# Patient Record
Sex: Female | Born: 1985
Health system: Southern US, Community
[De-identification: ages and names within clinical notes are randomized; demographics above are authoritative.]

## PROBLEM LIST (undated history)

## (undated) DIAGNOSIS — Z789 Other specified health status: Secondary | ICD-10-CM

## (undated) DIAGNOSIS — T07XXXA Unspecified multiple injuries, initial encounter: Secondary | ICD-10-CM

## (undated) HISTORY — PX: LEG SURGERY: SHX1003

## (undated) HISTORY — PX: MANDIBLE SURGERY: SHX707

## (undated) HISTORY — PX: NECK SURGERY: SHX720

## (undated) HISTORY — PX: SPINE SURGERY: SHX786

## (undated) HISTORY — DX: Unspecified multiple injuries, initial encounter: T07.XXXA

---

## 2004-07-07 ENCOUNTER — Inpatient Hospital Stay: Payer: Self-pay | Admitting: Certified Nurse Midwife

## 2005-05-02 ENCOUNTER — Emergency Department: Payer: Self-pay | Admitting: Emergency Medicine

## 2005-06-09 ENCOUNTER — Emergency Department: Payer: Self-pay | Admitting: Emergency Medicine

## 2005-06-11 ENCOUNTER — Emergency Department: Payer: Self-pay | Admitting: Emergency Medicine

## 2005-06-13 ENCOUNTER — Emergency Department: Payer: Self-pay | Admitting: Emergency Medicine

## 2005-07-17 ENCOUNTER — Emergency Department: Payer: Self-pay | Admitting: Emergency Medicine

## 2005-08-30 ENCOUNTER — Emergency Department: Payer: Self-pay | Admitting: Unknown Physician Specialty

## 2005-09-01 ENCOUNTER — Emergency Department: Payer: Self-pay | Admitting: Internal Medicine

## 2005-10-10 ENCOUNTER — Emergency Department: Payer: Self-pay | Admitting: Emergency Medicine

## 2005-10-10 ENCOUNTER — Ambulatory Visit: Payer: Self-pay | Admitting: Emergency Medicine

## 2006-01-20 ENCOUNTER — Observation Stay: Payer: Self-pay | Admitting: Obstetrics and Gynecology

## 2006-01-23 ENCOUNTER — Emergency Department: Payer: Self-pay | Admitting: Emergency Medicine

## 2006-03-30 ENCOUNTER — Observation Stay: Payer: Self-pay

## 2006-04-09 ENCOUNTER — Observation Stay: Payer: Self-pay | Admitting: Obstetrics and Gynecology

## 2006-04-14 ENCOUNTER — Observation Stay: Payer: Self-pay

## 2006-05-02 ENCOUNTER — Observation Stay: Payer: Self-pay

## 2006-05-24 ENCOUNTER — Observation Stay: Payer: Self-pay

## 2006-05-25 ENCOUNTER — Inpatient Hospital Stay: Payer: Self-pay

## 2006-06-12 ENCOUNTER — Emergency Department: Payer: Self-pay

## 2006-06-13 ENCOUNTER — Emergency Department: Payer: Self-pay | Admitting: Emergency Medicine

## 2006-06-14 ENCOUNTER — Emergency Department: Payer: Self-pay | Admitting: Emergency Medicine

## 2006-07-13 ENCOUNTER — Emergency Department: Payer: Self-pay | Admitting: Emergency Medicine

## 2006-07-27 ENCOUNTER — Emergency Department: Payer: Self-pay | Admitting: Emergency Medicine

## 2007-03-10 ENCOUNTER — Emergency Department: Payer: Self-pay | Admitting: Emergency Medicine

## 2007-03-10 ENCOUNTER — Emergency Department: Payer: Self-pay | Admitting: Unknown Physician Specialty

## 2007-04-28 ENCOUNTER — Emergency Department: Payer: Self-pay | Admitting: Emergency Medicine

## 2007-05-22 ENCOUNTER — Emergency Department: Payer: Self-pay

## 2007-10-08 ENCOUNTER — Emergency Department: Payer: Self-pay | Admitting: Emergency Medicine

## 2008-04-08 ENCOUNTER — Emergency Department: Payer: Self-pay | Admitting: Internal Medicine

## 2008-10-03 ENCOUNTER — Emergency Department: Payer: Self-pay

## 2009-04-03 ENCOUNTER — Emergency Department: Payer: Self-pay | Admitting: Emergency Medicine

## 2010-01-02 ENCOUNTER — Emergency Department: Payer: Self-pay | Admitting: Emergency Medicine

## 2013-05-09 ENCOUNTER — Emergency Department: Payer: Self-pay | Admitting: Emergency Medicine

## 2013-05-11 LAB — BETA STREP CULTURE(ARMC)

## 2013-08-15 ENCOUNTER — Emergency Department: Payer: Self-pay | Admitting: Internal Medicine

## 2014-08-18 ENCOUNTER — Emergency Department: Payer: Self-pay | Admitting: Emergency Medicine

## 2015-01-06 ENCOUNTER — Encounter: Payer: Self-pay | Admitting: Emergency Medicine

## 2015-01-06 DIAGNOSIS — K088 Other specified disorders of teeth and supporting structures: Secondary | ICD-10-CM | POA: Diagnosis not present

## 2015-01-06 DIAGNOSIS — K0381 Cracked tooth: Secondary | ICD-10-CM | POA: Diagnosis not present

## 2015-01-06 NOTE — ED Notes (Signed)
Patient ambulatory to triage with steady gait, without difficulty or distress noted; pt reports right lower dental pain today

## 2015-01-07 ENCOUNTER — Emergency Department
Admission: EM | Admit: 2015-01-07 | Discharge: 2015-01-07 | Disposition: A | Discharge status: Home / Self Care | Payer: PRIVATE HEALTH INSURANCE | Attending: Emergency Medicine | Admitting: Emergency Medicine

## 2015-01-07 DIAGNOSIS — K0889 Other specified disorders of teeth and supporting structures: Secondary | ICD-10-CM

## 2015-01-07 MED ORDER — PENICILLIN V POTASSIUM 250 MG PO TABS
500.0000 mg | ORAL_TABLET | Freq: Once | ORAL | Status: AC
  Administered 2015-01-07: 500 mg via ORAL

## 2015-01-07 MED ORDER — HYDROCODONE-ACETAMINOPHEN 5-325 MG PO TABS: 1.0000 | ORAL_TABLET | Freq: Four times a day (QID) | ORAL | Status: DC | PRN

## 2015-01-07 MED ORDER — HYDROCODONE-ACETAMINOPHEN 5-325 MG PO TABS
1.0000 | ORAL_TABLET | Freq: Once | ORAL | Status: AC
  Administered 2015-01-07: 1 via ORAL

## 2015-01-07 MED ORDER — PENICILLIN V POTASSIUM 250 MG PO TABS
ORAL_TABLET | ORAL | Status: AC
  Administered 2015-01-07: 500 mg via ORAL
  Filled 2015-01-07: qty 2

## 2015-01-07 MED ORDER — IBUPROFEN 600 MG PO TABS: 600.0000 mg | ORAL_TABLET | Freq: Three times a day (TID) | ORAL | Status: DC | PRN

## 2015-01-07 MED ORDER — IBUPROFEN 600 MG PO TABS
600.0000 mg | ORAL_TABLET | Freq: Once | ORAL | Status: AC
  Administered 2015-01-07: 600 mg via ORAL

## 2015-01-07 MED ORDER — LIDOCAINE VISCOUS 2 % MT SOLN
15.0000 mL | Freq: Once | OROMUCOSAL | Status: AC
  Administered 2015-01-07: 15 mL via OROMUCOSAL

## 2015-01-07 MED ORDER — HYDROCODONE-ACETAMINOPHEN 5-325 MG PO TABS
ORAL_TABLET | ORAL | Status: AC
  Administered 2015-01-07: 1 via ORAL
  Filled 2015-01-07: qty 1

## 2015-01-07 MED ORDER — IBUPROFEN 600 MG PO TABS
ORAL_TABLET | ORAL | Status: AC
  Administered 2015-01-07: 600 mg via ORAL
  Filled 2015-01-07: qty 1

## 2015-01-07 MED ORDER — LIDOCAINE VISCOUS 2 % MT SOLN
OROMUCOSAL | Status: AC
  Administered 2015-01-07: 15 mL via OROMUCOSAL
  Filled 2015-01-07: qty 15

## 2015-01-07 MED ORDER — PENICILLIN V POTASSIUM 500 MG PO TABS: 500.0000 mg | ORAL_TABLET | Freq: Four times a day (QID) | ORAL | Status: DC

## 2015-01-07 NOTE — ED Notes (Signed)
Patient ambulatory to room 18 with steady gait, without difficulty or distress noted; pt reports right lower dental pain today and the last 4 days slowly getting worse tylenol only lasting a 1-2 hrs now not at all. Pt is resting on the bed. Pt states has cavity and broken tooth to right lower back tooth. Pink mucous no swelling noted. Pt early radiated into whole right side of face and head pain was 10/10. Pt reports runny nose as well on the right side. She states no pain is 8/10. Also pt states ibuprofen doesn't work.

## 2015-01-07 NOTE — Discharge Instructions (Signed)
1. Take antibiotic as prescribed (Penicillin VK 500mg  four times daily x 7 days). 2. Take pain medicines as needed (Motrin/Norco #15). 3. Return to the ER for worsening symptoms, persistent vomiting, fever, difficulty breathing or other concerns.  Dental Pain A tooth ache may be caused by cavities (tooth decay). Cavities expose the nerve of the tooth to air and hot or cold temperatures. It may come from an infection or abscess (also called a boil or furuncle) around your tooth. It is also often caused by dental caries (tooth decay). This causes the pain you are having. DIAGNOSIS  Your caregiver can diagnose this problem by exam. TREATMENT   If caused by an infection, it may be treated with medications which kill germs (antibiotics) and pain medications as prescribed by your caregiver. Take medications as directed.  Only take over-the-counter or prescription medicines for pain, discomfort, or fever as directed by your caregiver.  Whether the tooth ache today is caused by infection or dental disease, you should see your dentist as soon as possible for further care. SEEK MEDICAL CARE IF: The exam and treatment you received today has been provided on an emergency basis only. This is not a substitute for complete medical or dental care. If your problem worsens or new problems (symptoms) appear, and you are unable to meet with your dentist, call or return to this location. SEEK IMMEDIATE MEDICAL CARE IF:   You have a fever.  You develop redness and swelling of your face, jaw, or neck.  You are unable to open your mouth.  You have severe pain uncontrolled by pain medicine. MAKE SURE YOU:   Understand these instructions.  Will watch your condition.  Will get help right away if you are not doing well or get worse. Document Released: 07/04/2005 Document Revised: 09/26/2011 Document Reviewed: 02/20/2008 [facility] Patient Information 2015 [place], [place]. This information is not intended to  replace advice given to you by your health care provider. Make sure you discuss any questions you have with your health care provider.

## 2015-01-07 NOTE — ED Provider Notes (Signed)
[facility] Emergency Department Provider Note  ____________________________________________  Time seen: Approximately 2:36 AM  I have reviewed the triage vital signs and the nursing notes.   HISTORY  Chief Complaint Dental Pain    HPI Jennifer Ross is a 29 y.o. female who presents with a 4 day history of right lower molar pain not alleviated by Tylenol. Patient states she has a known cavity and broken tooth. Her insurance with her job does not cake in until later this week. Patient complains of 8/10 dental pain radiating to the right side of her face and head. Denies fever, chills, vomiting, chest pain, shortness of breath. Eating makes the pain worse. Nothing makes the pain better.   History reviewed. No pertinent past medical history.  Patient is not a diabetic.  There are no active problems to display for this patient.   History reviewed. No pertinent past surgical history.   Allergies Review of patient's allergies indicates no known allergies.  No family history on file.  Social History History  Substance Use Topics  . Smoking status: Never Smoker   . Smokeless tobacco: Not on file  . Alcohol Use: No    Review of Systems Constitutional: No fever/chills Eyes: No visual changes. ENT: Positive for dental pain. No sore throat. Cardiovascular: Denies chest pain. Respiratory: Denies shortness of breath. Gastrointestinal: No abdominal pain.  No nausea, no vomiting.  No diarrhea.  No constipation. Genitourinary: Negative for dysuria. Musculoskeletal: Negative for back pain. Skin: Negative for rash. Neurological: Negative for headaches, focal weakness or numbness.  10-point ROS otherwise negative.  ____________________________________________   PHYSICAL EXAM:  VITAL SIGNS: ED Triage Vitals  Enc Vitals Group     BP 01/06/15 2357 125/79 mmHg     Pulse Rate 01/06/15 2357 72     Resp 01/06/15 2357 18     Temp 01/06/15 2357 98 F (36.7  C)     Temp Source 01/06/15 2357 Oral     SpO2 01/06/15 2357 100 %     Weight 01/06/15 2357 138 lb (62.596 kg)     Height 01/06/15 2357  (1.575 m)     Head Cir --      Peak Flow --      Pain Score 01/06/15 2356 10     Pain Loc --      Pain Edu? --      Excl. in GC? --     Constitutional: Alert and oriented. Well appearing and in no acute distress. Eyes: Conjunctivae are normal. PERRL. EOMI. Head: Atraumatic. Nose: No congestion/rhinnorhea. Mouth/Throat: Mucous membranes are moist.  Oropharynx non-erythematous. Right back molar with fracture; no intra/extraoral abscess noted. Neck: No stridor.   Cardiovascular: Normal rate, regular rhythm. Grossly normal heart sounds.  Good peripheral circulation. Respiratory: Normal respiratory effort.  No retractions. Lungs CTAB. Gastrointestinal: Soft and nontender. No distention. No abdominal bruits. No CVA tenderness. Musculoskeletal: No lower extremity tenderness nor edema.  No joint effusions. Neurologic:  Normal speech and language. No gross focal neurologic deficits are appreciated. Speech is normal. No gait instability. Skin:  Skin is warm, dry and intact. No rash noted. Psychiatric: Mood and affect are normal. Speech and behavior are normal.  ____________________________________________   LABS (all labs ordered are listed, but only abnormal results are displayed)  Labs Reviewed - No data to display ____________________________________________  EKG  None ____________________________________________  RADIOLOGY  None ____________________________________________   PROCEDURES  Procedure(s) performed: None  Critical Care performed: No  ____________________________________________   INITIAL  IMPRESSION / ASSESSMENT AND PLAN / ED COURSE  Pertinent labs & imaging results that were available during my care of the patient were reviewed by me and considered in my medical decision making (see chart for details).  28y/o  female presenting with dental pain. Plan for antibiotics, analgesics, dental follow-up. Strict return precautions given. Patient verbalizes understanding and agrees with plan of care. ____________________________________________   FINAL CLINICAL IMPRESSION(S) / ED DIAGNOSES  Final diagnoses:  Pain, dental      Irean Hong, MD 01/07/15 [phone]

## 2015-07-18 ENCOUNTER — Emergency Department
Admission: EM | Admit: 2015-07-18 | Discharge: 2015-07-18 | Disposition: A | Discharge status: Home / Self Care | Payer: BLUE CROSS/BLUE SHIELD | Attending: Emergency Medicine | Admitting: Emergency Medicine

## 2015-07-18 ENCOUNTER — Encounter: Payer: Self-pay | Admitting: Emergency Medicine

## 2015-07-18 DIAGNOSIS — K0889 Other specified disorders of teeth and supporting structures: Secondary | ICD-10-CM

## 2015-07-18 DIAGNOSIS — J029 Acute pharyngitis, unspecified: Secondary | ICD-10-CM | POA: Insufficient documentation

## 2015-07-18 DIAGNOSIS — Z792 Long term (current) use of antibiotics: Secondary | ICD-10-CM | POA: Diagnosis not present

## 2015-07-18 MED ORDER — AMOXICILLIN 500 MG PO TABS: 500.0000 mg | ORAL_TABLET | Freq: Three times a day (TID) | ORAL | Status: DC

## 2015-07-18 MED ORDER — NAPROXEN 500 MG PO TABS: 500.0000 mg | ORAL_TABLET | Freq: Two times a day (BID) | ORAL | Status: DC

## 2015-07-18 NOTE — ED Notes (Signed)
Sore throat since yesterday.

## 2015-07-18 NOTE — Discharge Instructions (Signed)

## 2015-07-18 NOTE — ED Provider Notes (Signed)
[facility] Emergency Department Provider Note ____________________________________________  Time seen: Approximately 6:49 PM  I have reviewed the triage vital signs and the nursing notes.   HISTORY  Chief Complaint Sore Throat   HPI Jennifer Ross is a 29 y.o. female who presents to the emergency department for evaluation of sore throat and dental pain. She states that her throat has been sore for the past couple of days. She has taken ibuprofen without relief. She states that her right wisdom tooth keeps chipping away and just before the sore throat started a large piece broke off. That area has been painful since then.   History reviewed. No pertinent past medical history.  There are no active problems to display for this patient.   History reviewed. No pertinent past surgical history.  Current Outpatient Rx  Name  Route  Sig  Dispense  Refill  . HYDROcodone-acetaminophen (NORCO) 5-325 MG per tablet   Oral   Take 1 tablet by mouth every 6 (six) hours as needed for moderate pain.   15 tablet   0   . ibuprofen (ADVIL,MOTRIN) 600 MG tablet   Oral   Take 1 tablet (600 mg total) by mouth every 8 (eight) hours as needed.   15 tablet   0   . penicillin v potassium (VEETID) 500 MG tablet   Oral   Take 1 tablet (500 mg total) by mouth 4 (four) times daily.   28 tablet   0     Allergies Review of patient's allergies indicates no known allergies.  No family history on file.  Social History Social History  Substance Use Topics  . Smoking status: Never Smoker   . Smokeless tobacco: None  . Alcohol Use: No    Review of Systems Constitutional: No fever/chills Eyes: No visual changes. ENT: No sore throat. Cardiovascular: Denies chest pain. Respiratory: Denies shortness of breath. Gastrointestinal: No abdominal pain.  No nausea, no vomiting.  Genitourinary: Negative for dysuria. Musculoskeletal: Negative for back pain. Skin: Negative for  rash. Neurological: Negative for headaches, focal weakness or numbness. 10-point ROS otherwise negative.  ____________________________________________   PHYSICAL EXAM:  VITAL SIGNS: ED Triage Vitals  Enc Vitals Group     BP 07/18/15 1828 121/59 mmHg     Pulse Rate 07/18/15 1828 70     Resp 07/18/15 1828 18     Temp 07/18/15 1828 98.1 F (36.7 C)     Temp Source 07/18/15 1828 Oral     SpO2 07/18/15 1828 100 %     Weight 07/18/15 1828 125 lb (56.7 kg)     Height 07/18/15 1828 5\' 2"  (1.575 m)     Head Cir --      Peak Flow --      Pain Score 07/18/15 1829 8     Pain Loc --      Pain Edu? --      Excl. in GC? --     Constitutional: Alert and oriented. Well appearing and in no acute distress. Eyes: Conjunctivae are normal. PERRL. EOMI. Head: Atraumatic. Nose: No congestion/rhinnorhea. Mouth/Throat: Mucous membranes are moist.  Oropharynx erythematous without exudate. Periodontal Exam    Neck: No stridor.  Hematological/Lymphatic/Immunilogical: No cervical lymphadenopathy. Cardiovascular:   Good peripheral circulation. Respiratory: Normal respiratory effort.  No retractions. Musculoskeletal: No lower extremity tenderness nor edema.  No joint effusions. Neurologic:  Normal speech and language. No gross focal neurologic deficits are appreciated. Speech is normal. No gait instability. Skin:  Skin is warm, dry  and intact. No rash noted. Psychiatric: Mood and affect are normal. Speech and behavior are normal.  ____________________________________________   LABS (all labs ordered are listed, but only abnormal results are displayed)  Labs Reviewed - No data to display ____________________________________________   RADIOLOGY  Not indicated ____________________________________________   PROCEDURES  Procedure(s) performed: None  Critical Care performed: No  ____________________________________________   INITIAL IMPRESSION / ASSESSMENT AND PLAN / ED  COURSE  Pertinent labs & imaging results that were available during my care of the patient were reviewed by me and considered in my medical decision making (see chart for details).  Patient was advised that the pharyngitis is likely viral. She was advised to take ibuprofen or Tylenol if needed for pain.  Patient was advised to see the dentist within 14 days. Also advised to take the antibiotic until finished. Instructed to return to the ER for symptoms that change or worsen if you are unable to schedule an appointment. ____________________________________________   FINAL CLINICAL IMPRESSION(S) / ED DIAGNOSES  Final diagnoses:  Pharyngitis  Pain, dental       Chinita Pester, FNP 07/18/15 2108  Sharman Cheek, MD 07/18/15 2245

## 2016-06-20 ENCOUNTER — Encounter: Payer: Self-pay | Admitting: Emergency Medicine

## 2016-06-20 ENCOUNTER — Emergency Department
Admission: EM | Admit: 2016-06-20 | Discharge: 2016-06-20 | Disposition: A | Discharge status: Home / Self Care | Payer: BLUE CROSS/BLUE SHIELD | Attending: Emergency Medicine | Admitting: Emergency Medicine

## 2016-06-20 DIAGNOSIS — B9789 Other viral agents as the cause of diseases classified elsewhere: Secondary | ICD-10-CM

## 2016-06-20 DIAGNOSIS — J988 Other specified respiratory disorders: Secondary | ICD-10-CM

## 2016-06-20 DIAGNOSIS — R509 Fever, unspecified: Secondary | ICD-10-CM | POA: Diagnosis present

## 2016-06-20 DIAGNOSIS — J069 Acute upper respiratory infection, unspecified: Secondary | ICD-10-CM | POA: Insufficient documentation

## 2016-06-20 MED ORDER — BENZONATATE 100 MG PO CAPS: 200.0000 mg | ORAL_CAPSULE | Freq: Three times a day (TID) | ORAL | 0 refills | Status: AC | PRN

## 2016-06-20 NOTE — ED Provider Notes (Signed)
[facility] Emergency Department Provider Note  ____________________________________________   First MD Initiated Contact with Patient 06/20/16 1454     (approximate)  I have reviewed the triage vital signs and the nursing notes.   HISTORY  Chief Complaint Hoarse and Fever    HPI Jennifer Ross is a 30 y.o. female . Complaint of fever off and on for several days. Patient states she has productive cough. She has not actually taken her temperature but has felt warm and occasionally has some chills. She denies any shortness of breath. She mostly complains about throat soreness especially at night when she is coughing and the fact that her voice is hoarse. She has taken over-the-counter NyQuil with minimal relief. Patient denies any sneezing. Denies any history of asthma, bronchitis, pneumonia. She rates her pain as 5 out of 10.   History reviewed. No pertinent past medical history.  There are no active problems to display for this patient.   History reviewed. No pertinent surgical history.  Prior to Admission medications   Medication Sig Start Date End Date Taking? Authorizing Provider  benzonatate (TESSALON PERLES) 100 MG capsule Take 2 capsules (200 mg total) by mouth 3 (three) times daily as needed for cough. 06/20/16 06/20/17  Tommi Rumpshonda L Dyneshia Baccam, PA-C    Allergies Patient has no known allergies.  No family history on file.  Social History Social History  Substance Use Topics  . Smoking status: Never Smoker  . Smokeless tobacco: Never Used  . Alcohol use No    Review of Systems Constitutional: Subjective fever/chills Eyes: No visual changes. ENT: Positive sore throat. Positive nasal congestion Cardiovascular: Denies chest pain. Respiratory: Denies shortness of breath. Positive productive cough. Gastrointestinal: No abdominal pain.  No nausea, no vomiting.   Musculoskeletal: Negative for back pain. Skin: Negative for rash. Neurological:  Negative for headaches, focal weakness or numbness.  10-point ROS otherwise negative.  ____________________________________________   PHYSICAL EXAM:  VITAL SIGNS: ED Triage Vitals  Enc Vitals Group     BP 06/20/16 1251 119/74     Pulse Rate 06/20/16 1251 97     Resp 06/20/16 1251 16     Temp 06/20/16 1251 98.6 F (37 C)     Temp Source 06/20/16 1251 Oral     SpO2 06/20/16 1251 97 %     Weight 06/20/16 1252 145 lb (65.8 kg)     Height 06/20/16 1252 5\' 2"  (1.575 m)     Head Circumference --      Peak Flow --      Pain Score 06/20/16 1405 5     Pain Loc --      Pain Edu? --      Excl. in GC? --     Constitutional: Alert and oriented. Well appearing and in no acute distress. Eyes: Conjunctivae are normal. PERRL. EOMI. Head: Atraumatic.  Nontender sinuses to percussion. Nose: Moderate congestion/no rhinnorhea.  EACs are clear. TMs are dull bilaterally without injection or effusion. Mouth/Throat: Mucous membranes are moist.  Oropharynx non-erythematous. Moderate posterior drainage seen. Neck: No stridor.   Hematological/Lymphatic/Immunilogical: No cervical lymphadenopathy. Cardiovascular: Normal rate, regular rhythm. Grossly normal heart sounds.  Good peripheral circulation. Respiratory: Normal respiratory effort.  No retractions. Lungs CTAB. Musculoskeletal: Moves upper and lower extremities without any difficulty. Normal gait was noted. Neurologic:  Normal speech and language. No gross focal neurologic deficits are appreciated. No gait instability. Skin:  Skin is warm, dry and intact. No rash noted. Psychiatric: Mood and affect are normal.  Speech and behavior are normal.  ____________________________________________   LABS (all labs ordered are listed, but only abnormal results are displayed)  Labs Reviewed - No data to display  PROCEDURES  Procedure(s) performed: None  Procedures  Critical Care performed:  No  ____________________________________________   INITIAL IMPRESSION / ASSESSMENT AND PLAN / ED COURSE  Pertinent labs & imaging results that were available during my care of the patient were reviewed by me and considered in my medical decision making (see chart for details).    Clinical Course    Patient is to obtain over-the-counter Zyrtec or Claritin for her drainage. She is to increase fluids. She is given a prescription for [employer] Perles as needed for cough. [facility] if any continued problems.  ____________________________________________   FINAL CLINICAL IMPRESSION(S) / ED DIAGNOSES  Final diagnoses:  Viral respiratory illness      NEW MEDICATIONS STARTED DURING THIS VISIT:  Discharge Medication List as of 06/20/2016  3:14 PM    START taking these medications   Details  benzonatate (TESSALON PERLES) 100 MG capsule Take 2 capsules (200 mg total) by mouth 3 (three) times daily as needed for cough., Starting Mon 06/20/2016, Until Tue 06/20/2017, Print         Note:  This document was prepared using Dragon voice recognition software and may include unintentional dictation errors.    Tommi RumpsRhonda L Mavery Milling, PA-C 06/20/16 1650    Emily FilbertJonathan E Williams, MD 06/21/16 2019

## 2016-06-20 NOTE — ED Triage Notes (Signed)
Fever on and off.  Now with hoarse and lost voice .  Also porductive cough

## 2016-06-20 NOTE — ED Notes (Signed)
Pt c/o hoarsness and dry throat with productive cough xfew days. States some chills. Pt denies any SOB

## 2016-06-20 NOTE — Discharge Instructions (Signed)
Obtained over-the-counter Zyrtec or Claritin for posterior drainage which will help decrease the amount of coughing that you do at tonight. Increase fluids. Tessalon as needed for cough. [facility] if any continued problems.

## 2016-12-09 ENCOUNTER — Encounter: Payer: Self-pay | Admitting: Advanced Practice Midwife

## 2017-05-13 DIAGNOSIS — I7771 Dissection of carotid artery: Secondary | ICD-10-CM | POA: Insufficient documentation

## 2017-12-13 ENCOUNTER — Ambulatory Visit: Payer: Medicaid Other | Admitting: Physical Therapy

## 2017-12-20 ENCOUNTER — Ambulatory Visit: Payer: Medicaid Other | Attending: Plastic Surgery | Admitting: Physical Therapy

## 2017-12-20 ENCOUNTER — Other Ambulatory Visit: Payer: Self-pay

## 2017-12-20 ENCOUNTER — Encounter: Payer: Self-pay | Admitting: Physical Therapy

## 2017-12-20 ENCOUNTER — Ambulatory Visit: Payer: Medicaid Other | Admitting: Physical Therapy

## 2017-12-20 DIAGNOSIS — M6281 Muscle weakness (generalized): Secondary | ICD-10-CM | POA: Diagnosis present

## 2017-12-20 DIAGNOSIS — M79602 Pain in left arm: Secondary | ICD-10-CM | POA: Diagnosis present

## 2017-12-20 DIAGNOSIS — M62838 Other muscle spasm: Secondary | ICD-10-CM | POA: Diagnosis present

## 2017-12-20 DIAGNOSIS — M542 Cervicalgia: Secondary | ICD-10-CM | POA: Diagnosis not present
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
## 2017-12-26 ENCOUNTER — Encounter: Payer: PRIVATE HEALTH INSURANCE | Admitting: Physical Therapy

## 2017-12-27 ENCOUNTER — Ambulatory Visit: Payer: Medicaid Other | Admitting: Physical Therapy

## 2018-01-01 ENCOUNTER — Encounter: Payer: PRIVATE HEALTH INSURANCE | Admitting: Physical Therapy

## 2018-01-02 ENCOUNTER — Encounter: Payer: Self-pay | Admitting: Physical Therapy

## 2018-01-02 ENCOUNTER — Ambulatory Visit: Payer: Medicaid Other | Admitting: Physical Therapy

## 2018-01-02 DIAGNOSIS — M79602 Pain in left arm: Secondary | ICD-10-CM

## 2018-01-02 DIAGNOSIS — M542 Cervicalgia: Secondary | ICD-10-CM

## 2018-01-02 DIAGNOSIS — M6281 Muscle weakness (generalized): Secondary | ICD-10-CM

## 2018-01-02 DIAGNOSIS — M62838 Other muscle spasm: Secondary | ICD-10-CM
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
## 2018-01-03 ENCOUNTER — Encounter: Payer: PRIVATE HEALTH INSURANCE | Admitting: Physical Therapy

## 2018-01-10 ENCOUNTER — Encounter: Payer: Self-pay | Admitting: Physical Therapy

## 2018-01-10 ENCOUNTER — Ambulatory Visit: Payer: Medicaid Other | Admitting: Physical Therapy

## 2018-01-10 DIAGNOSIS — M542 Cervicalgia: Secondary | ICD-10-CM

## 2018-01-10 DIAGNOSIS — M62838 Other muscle spasm: Secondary | ICD-10-CM

## 2018-01-10 DIAGNOSIS — M6281 Muscle weakness (generalized): Secondary | ICD-10-CM

## 2018-01-10 DIAGNOSIS — M79602 Pain in left arm: Secondary | ICD-10-CM
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
## 2018-01-15 ENCOUNTER — Ambulatory Visit: Payer: Medicaid Other | Attending: Plastic Surgery

## 2018-01-15 DIAGNOSIS — M79602 Pain in left arm: Secondary | ICD-10-CM | POA: Insufficient documentation

## 2018-01-15 DIAGNOSIS — M542 Cervicalgia: Secondary | ICD-10-CM | POA: Diagnosis present

## 2018-01-15 DIAGNOSIS — M6281 Muscle weakness (generalized): Secondary | ICD-10-CM | POA: Diagnosis present

## 2018-01-15 DIAGNOSIS — M62838 Other muscle spasm: Secondary | ICD-10-CM | POA: Insufficient documentation
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
## 2018-01-17 ENCOUNTER — Ambulatory Visit: Payer: Medicaid Other | Admitting: Physical Therapy

## 2018-01-22 ENCOUNTER — Ambulatory Visit: Payer: Medicaid Other | Admitting: Physical Therapy

## 2018-01-25 ENCOUNTER — Ambulatory Visit: Payer: Medicaid Other | Admitting: Physical Therapy

## 2018-01-29 ENCOUNTER — Ambulatory Visit: Payer: Medicaid Other | Admitting: Physical Therapy

## 2018-01-31 ENCOUNTER — Ambulatory Visit: Payer: Medicaid Other | Admitting: Physical Therapy

## 2018-02-05 ENCOUNTER — Ambulatory Visit: Payer: Medicaid Other | Admitting: Physical Therapy

## 2018-02-08 ENCOUNTER — Ambulatory Visit: Payer: Medicaid Other | Admitting: Physical Therapy

## 2018-02-09 ENCOUNTER — Telehealth: Payer: Self-pay | Admitting: Physical Therapy
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
## 2018-02-12 ENCOUNTER — Ambulatory Visit: Payer: Medicaid Other | Admitting: Physical Therapy

## 2018-02-13 ENCOUNTER — Ambulatory Visit: Payer: Medicaid Other | Admitting: Physical Therapy

## 2018-02-13 ENCOUNTER — Encounter: Payer: Self-pay | Admitting: Physical Therapy

## 2018-02-13 DIAGNOSIS — M62838 Other muscle spasm: Secondary | ICD-10-CM

## 2018-02-13 DIAGNOSIS — M6281 Muscle weakness (generalized): Secondary | ICD-10-CM

## 2018-02-13 DIAGNOSIS — M542 Cervicalgia: Secondary | ICD-10-CM
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
## 2018-02-15 ENCOUNTER — Encounter: Payer: PRIVATE HEALTH INSURANCE | Admitting: Physical Therapy

## 2018-02-20 ENCOUNTER — Ambulatory Visit: Payer: Medicaid Other | Admitting: Physical Therapy

## 2018-02-21 ENCOUNTER — Ambulatory Visit: Payer: Medicaid Other | Attending: Plastic Surgery | Admitting: Physical Therapy

## 2018-02-21 ENCOUNTER — Encounter: Payer: Self-pay | Admitting: Physical Therapy

## 2018-02-21 DIAGNOSIS — M62838 Other muscle spasm: Secondary | ICD-10-CM

## 2018-02-21 DIAGNOSIS — M6281 Muscle weakness (generalized): Secondary | ICD-10-CM | POA: Diagnosis present

## 2018-02-21 DIAGNOSIS — M542 Cervicalgia: Secondary | ICD-10-CM | POA: Diagnosis present
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
## 2018-02-22 DIAGNOSIS — S13121A Dislocation of C1/C2 cervical vertebrae, initial encounter: Secondary | ICD-10-CM | POA: Insufficient documentation

## 2018-02-26 ENCOUNTER — Ambulatory Visit: Payer: Medicaid Other | Admitting: Physical Therapy

## 2018-02-26 ENCOUNTER — Encounter: Payer: Self-pay | Admitting: Physical Therapy

## 2018-02-26 DIAGNOSIS — M542 Cervicalgia: Secondary | ICD-10-CM

## 2018-02-26 DIAGNOSIS — M62838 Other muscle spasm: Secondary | ICD-10-CM

## 2018-02-26 DIAGNOSIS — M6281 Muscle weakness (generalized): Secondary | ICD-10-CM
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
## 2018-02-27 ENCOUNTER — Ambulatory Visit: Payer: Medicaid Other | Admitting: Physical Therapy

## 2018-02-28 ENCOUNTER — Ambulatory Visit: Payer: Medicaid Other | Admitting: Physical Therapy

## 2018-03-01 ENCOUNTER — Ambulatory Visit: Payer: Medicaid Other | Admitting: Physical Therapy

## 2018-03-01 ENCOUNTER — Encounter: Payer: Self-pay | Admitting: Physical Therapy

## 2018-03-01 DIAGNOSIS — M6281 Muscle weakness (generalized): Secondary | ICD-10-CM

## 2018-03-01 DIAGNOSIS — M62838 Other muscle spasm: Secondary | ICD-10-CM

## 2018-03-01 DIAGNOSIS — M542 Cervicalgia: Secondary | ICD-10-CM | POA: Diagnosis not present
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
## 2018-08-13 ENCOUNTER — Ambulatory Visit (HOSPITAL_COMMUNITY)
Admission: EM | Admit: 2018-08-13 | Discharge: 2018-08-13 | Disposition: A | Discharge status: Home / Self Care | Payer: No Typology Code available for payment source | Attending: Urgent Care | Admitting: Urgent Care

## 2018-08-13 ENCOUNTER — Encounter (HOSPITAL_COMMUNITY): Payer: Self-pay

## 2018-08-13 ENCOUNTER — Other Ambulatory Visit: Payer: Self-pay

## 2018-08-13 DIAGNOSIS — R05 Cough: Secondary | ICD-10-CM | POA: Insufficient documentation

## 2018-08-13 DIAGNOSIS — R059 Cough, unspecified: Secondary | ICD-10-CM

## 2018-08-13 DIAGNOSIS — R69 Illness, unspecified: Secondary | ICD-10-CM | POA: Insufficient documentation

## 2018-08-13 DIAGNOSIS — J111 Influenza due to unidentified influenza virus with other respiratory manifestations: Secondary | ICD-10-CM

## 2018-08-13 MED ORDER — BENZONATATE 100 MG PO CAPS: 100.0000 mg | ORAL_CAPSULE | Freq: Three times a day (TID) | ORAL | 0 refills | Status: DC | PRN

## 2018-08-13 MED ORDER — OSELTAMIVIR PHOSPHATE 75 MG PO CAPS: 75.0000 mg | ORAL_CAPSULE | Freq: Two times a day (BID) | ORAL | 0 refills | Status: DC

## 2018-08-13 MED FILL — BENZONATATE 100 MG CAPS: 100 | 10 days supply | Qty: 60 | Fill #0

## 2018-08-13 MED FILL — OSELTAMIVIR PHOS 75 MG CAP: 75 | 5 days supply | Qty: 10 | Fill #0

## 2018-08-13 NOTE — Discharge Instructions (Signed)
We will manage this as a viral syndrome. For sore throat or cough try using a honey-based tea. Use 3 teaspoons of honey with juice squeezed from half lemon. Place shaved pieces of ginger into 1/2-1 cup of water and warm over stove top. Then mix the ingredients and repeat every 4 hours as needed. Please take ibuprofen 400mg every 6 hours alternating with OR taken together with Tylenol 500mg every 6 hours. Hydrate very well with at least 2 liters of water. Eat light meals such as soups to replenish electrolytes and soft fruits, veggies. Start an antihistamine like Zyrtec, Allegra or Claritin. You may pick up over-the-counter pseudoephedrine (Sudafed) and use this for post-nasal drainage, sinus congestion at a dose of 60mg every 8 hours or 120mg every 12 hours as needed. ° °

## 2018-08-13 NOTE — ED Provider Notes (Signed)
MRN: [id number] DOB: Mar 30, 1986  Subjective:   Jennifer Ross is a 33 y.o. female presenting for 3 day history of moderate-severe, worsening, constant malaise and fatigue. Patient has had exposure to flu with her daughter's grandmother.   No current facility-administered medications for this encounter.   Current Outpatient Medications:  .  gabapentin (NEURONTIN) 100 MG capsule, Take 100 mg by mouth 3 (three) times daily., Disp: , Rfl:  .  gabapentin (NEURONTIN) 300 MG capsule, Take 300 mg by mouth 3 (three) times daily., Disp: , Rfl:  .  traMADol (ULTRAM) 50 MG tablet, Take 50 mg by mouth every 6 (six) hours as needed., Disp: , Rfl:    No Known Allergies  Has a pmh of neuropathy.   Has had jaw surgery (x3), leg surgery.  Review of Systems  Constitutional: Positive for chills and malaise/fatigue. Negative for fever.  HENT: Negative for congestion, ear pain, sinus pain and sore throat.   Eyes: Negative for pain, discharge and redness.  Respiratory: Positive for cough and sputum production (once). Negative for shortness of breath and wheezing.   Cardiovascular: Negative for chest pain.  Gastrointestinal: Negative for abdominal pain, constipation, diarrhea, nausea and vomiting.  Genitourinary: Negative for dysuria and hematuria.  Musculoskeletal: Positive for myalgias.  Skin: Negative for rash.  Neurological: Negative for dizziness and headaches.  Psychiatric/Behavioral: Negative for depression. The patient is not nervous/anxious.     Objective:   Vitals: BP 121/77 (BP Location: Left Arm)   Pulse 85   Temp 98.6 F (37 C) (Oral)   Resp 18   Wt 170 lb (77.1 kg)   LMP 08/13/2018   SpO2 98%   BMI 31.09 kg/m   Physical Exam Constitutional:      General: She is not in acute distress.    Appearance: Normal appearance. She is well-developed and normal weight. She is not ill-appearing, toxic-appearing or diaphoretic.  HENT:     Head: Normocephalic and atraumatic.     Right  Ear: Tympanic membrane and ear canal normal. No drainage or tenderness. No middle ear effusion. Tympanic membrane is not erythematous.     Left Ear: Tympanic membrane and ear canal normal. No drainage or tenderness.  No middle ear effusion. Tympanic membrane is not erythematous.     Nose: Nose normal. No congestion or rhinorrhea.     Mouth/Throat:     Mouth: Mucous membranes are moist. No oral lesions.     Pharynx: Oropharynx is clear. No pharyngeal swelling, oropharyngeal exudate, posterior oropharyngeal erythema or uvula swelling.     Tonsils: No tonsillar exudate or tonsillar abscesses.  Eyes:     General: No scleral icterus.       Right eye: No discharge.        Left eye: No discharge.     Extraocular Movements: Extraocular movements intact.     Right eye: Normal extraocular motion.     Left eye: Normal extraocular motion.     Conjunctiva/sclera: Conjunctivae normal.     Pupils: Pupils are equal, round, and reactive to light.  Neck:     Musculoskeletal: Normal range of motion and neck supple.  Cardiovascular:     Rate and Rhythm: Normal rate and regular rhythm.     Pulses: Normal pulses.     Heart sounds: Normal heart sounds. No murmur. No friction rub. No gallop.   Pulmonary:     Effort: Pulmonary effort is normal. No respiratory distress.     Breath sounds: Normal breath sounds. No stridor. No wheezing,  rhonchi or rales.  Lymphadenopathy:     Cervical: No cervical adenopathy.  Skin:    General: Skin is warm and dry.     Findings: No rash.  Neurological:     General: No focal deficit present.     Mental Status: She is alert and oriented to person, place, and time.  Psychiatric:        Mood and Affect: Mood normal.        Behavior: Behavior normal.        Thought Content: Thought content normal.        Judgment: Judgment normal.    Assessment and Plan :   Influenza-like illness  Cough  Cover for influenza with Tamiflu given symptoms that, acute onset, physical exam  findings.  Use supportive care, rest, fluids, hydration, light meals, schedule Tylenol and ibuprofen. Counseled patient on potential for adverse effects with medications prescribed today, patient verbalized understanding.  ER and return-to-clinic precautions discussed, patient verbalized understanding.    Wallis BambergMani, Esma Kilts, [place] JerseyPA-C 08/13/18 [phone]

## 2018-08-13 NOTE — ED Triage Notes (Signed)
Pt cc fever , coughing , runny nose and body aches.  X 4 days pt has been exposed to the flu by her daughter's grandmother.

## 2018-08-22 ENCOUNTER — Encounter: Payer: Self-pay | Admitting: Obstetrics & Gynecology

## 2018-08-22 ENCOUNTER — Ambulatory Visit (INDEPENDENT_AMBULATORY_CARE_PROVIDER_SITE_OTHER): Payer: No Typology Code available for payment source | Admitting: Obstetrics & Gynecology

## 2018-08-22 DIAGNOSIS — Z1151 Encounter for screening for human papillomavirus (HPV): Secondary | ICD-10-CM | POA: Diagnosis not present

## 2018-08-22 DIAGNOSIS — Z113 Encounter for screening for infections with a predominantly sexual mode of transmission: Secondary | ICD-10-CM

## 2018-08-22 DIAGNOSIS — Z124 Encounter for screening for malignant neoplasm of cervix: Secondary | ICD-10-CM | POA: Diagnosis not present

## 2018-08-22 DIAGNOSIS — N852 Hypertrophy of uterus: Secondary | ICD-10-CM

## 2018-08-22 DIAGNOSIS — Z01419 Encounter for gynecological examination (general) (routine) without abnormal findings: Secondary | ICD-10-CM | POA: Diagnosis not present
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
## 2018-08-23 LAB — HEPATITIS C ANTIBODY: Hep C Virus Ab: <0.1 s/co ratio (ref 0.0–0.9)

## 2018-08-23 LAB — HIV ANTIBODY (ROUTINE TESTING W REFLEX): HIV Screen 4th Generation wRfx: NONREACTIVE

## 2018-08-23 LAB — RPR: RPR Ser Ql: NONREACTIVE

## 2018-08-23 LAB — HEPATITIS B SURFACE ANTIGEN: Hepatitis B Surface Ag: NEGATIVE

## 2018-08-24 MED FILL — GABAPENTIN 300 MG CAPSULE: 300 | 90 days supply | Qty: 270 | Fill #0

## 2018-08-24 MED FILL — ZOLPIDEM TARTRATE 5 MG TAB: 5 | 30 days supply | Qty: 30 | Fill #0

## 2018-08-24 MED FILL — ESCITALOPRAM 5 MG TABLET: 5 | 90 days supply | Qty: 90 | Fill #0

## 2018-08-27 LAB — CYTOLOGY - PAP
Chlamydia: NEGATIVE
Diagnosis: NEGATIVE
HPV: NOT DETECTED
Neisseria Gonorrhea: NEGATIVE
Trichomonas: NEGATIVE

## 2018-09-04 ENCOUNTER — Telehealth: Payer: Self-pay | Admitting: General Practice

## 2018-09-04 NOTE — Telephone Encounter (Signed)
Called & informed patient of results. Patient verbalized understanding & had no questions.

## 2018-09-04 NOTE — Telephone Encounter (Signed)
-----   Message from Willodean Rosenthal, MD sent at 09/03/2018  4:59 PM EST ----- Please call pt. Her STI screen and PAP are all WNL. We will see her back in 1 year or sooner prn.   Thx, Cl[facility]-S

## 2018-12-21 ENCOUNTER — Other Ambulatory Visit: Payer: Self-pay

## 2018-12-21 ENCOUNTER — Other Ambulatory Visit (HOSPITAL_COMMUNITY)
Admission: RE | Admit: 2018-12-21 | Discharge: 2018-12-21 | Disposition: A | Discharge status: Home / Self Care | Payer: No Typology Code available for payment source | Source: Ambulatory Visit | Attending: Obstetrics & Gynecology | Admitting: Obstetrics & Gynecology

## 2018-12-21 DIAGNOSIS — N898 Other specified noninflammatory disorders of vagina: Secondary | ICD-10-CM | POA: Insufficient documentation

## 2018-12-24 ENCOUNTER — Other Ambulatory Visit: Payer: Self-pay | Admitting: Obstetrics & Gynecology

## 2018-12-24 LAB — CERVICOVAGINAL ANCILLARY ONLY
Bacterial vaginitis: POSITIVE — AB
Candida vaginitis: NEGATIVE
Chlamydia: NEGATIVE
Neisseria Gonorrhea: NEGATIVE
Trichomonas: NEGATIVE

## 2018-12-24 MED ORDER — METRONIDAZOLE 500 MG PO TABS: 500.0000 mg | ORAL_TABLET | Freq: Two times a day (BID) | ORAL | 0 refills | Status: DC

## 2018-12-24 MED FILL — metroNIDAZOLE 500 MG TABS: 500 | 7 days supply | Qty: 14 | Fill #0

## 2018-12-24 NOTE — Progress Notes (Signed)
Flagyl prescribed for BV Patient notified via Mychart. 

## 2019-02-06 ENCOUNTER — Other Ambulatory Visit: Payer: Self-pay

## 2019-02-06 DIAGNOSIS — N898 Other specified noninflammatory disorders of vagina: Secondary | ICD-10-CM

## 2019-02-06 MED ORDER — METRONIDAZOLE 500 MG PO TABS: 500.0000 mg | ORAL_TABLET | Freq: Two times a day (BID) | ORAL | 0 refills | Status: DC

## 2019-02-06 NOTE — Progress Notes (Signed)
Having BV Symptoms approved refill with Dr. Hulan Fray

## 2019-02-11 ENCOUNTER — Other Ambulatory Visit: Payer: Self-pay

## 2019-02-11 DIAGNOSIS — Z20822 Contact with and (suspected) exposure to covid-19: Secondary | ICD-10-CM

## 2019-04-25 ENCOUNTER — Ambulatory Visit (HOSPITAL_COMMUNITY): Payer: No Typology Code available for payment source

## 2019-04-25 ENCOUNTER — Other Ambulatory Visit: Payer: Self-pay

## 2019-04-25 DIAGNOSIS — Z20822 Contact with and (suspected) exposure to covid-19: Secondary | ICD-10-CM

## 2019-05-15 ENCOUNTER — Ambulatory Visit (HOSPITAL_COMMUNITY): Payer: No Typology Code available for payment source

## 2019-05-23 ENCOUNTER — Other Ambulatory Visit: Payer: Self-pay

## 2019-05-23 ENCOUNTER — Ambulatory Visit: Payer: No Typology Code available for payment source

## 2019-05-23 ENCOUNTER — Other Ambulatory Visit (HOSPITAL_COMMUNITY)
Admission: RE | Admit: 2019-05-23 | Discharge: 2019-05-23 | Disposition: A | Discharge status: Home / Self Care | Payer: No Typology Code available for payment source | Source: Ambulatory Visit | Attending: Obstetrics & Gynecology | Admitting: Obstetrics & Gynecology

## 2019-05-23 DIAGNOSIS — N898 Other specified noninflammatory disorders of vagina: Secondary | ICD-10-CM | POA: Insufficient documentation

## 2019-05-28 LAB — CERVICOVAGINAL ANCILLARY ONLY
Bacterial Vaginitis (gardnerella): NEGATIVE
Candida Glabrata: NEGATIVE
Candida Vaginitis: NEGATIVE
Chlamydia: NEGATIVE
Comment: NEGATIVE
Comment: NEGATIVE
Comment: NEGATIVE
Comment: NEGATIVE
Comment: NEGATIVE
Comment: NORMAL
Neisseria Gonorrhea: NEGATIVE
Trichomonas: NEGATIVE

## 2019-05-29 ENCOUNTER — Ambulatory Visit (HOSPITAL_COMMUNITY): Admission: RE | Admit: 2019-05-29 | Payer: No Typology Code available for payment source | Source: Ambulatory Visit

## 2019-06-21 ENCOUNTER — Other Ambulatory Visit: Payer: Self-pay

## 2019-06-21 ENCOUNTER — Ambulatory Visit (INDEPENDENT_AMBULATORY_CARE_PROVIDER_SITE_OTHER): Payer: No Typology Code available for payment source

## 2019-06-21 ENCOUNTER — Other Ambulatory Visit (HOSPITAL_COMMUNITY)
Admission: RE | Admit: 2019-06-21 | Discharge: 2019-06-21 | Disposition: A | Discharge status: Home / Self Care | Payer: No Typology Code available for payment source | Source: Ambulatory Visit | Attending: Obstetrics & Gynecology | Admitting: Obstetrics & Gynecology

## 2019-06-21 DIAGNOSIS — N898 Other specified noninflammatory disorders of vagina: Secondary | ICD-10-CM

## 2019-06-21 DIAGNOSIS — B373 Candidiasis of vulva and vagina: Secondary | ICD-10-CM

## 2019-06-21 DIAGNOSIS — B3731 Acute candidiasis of vulva and vagina: Secondary | ICD-10-CM

## 2019-06-21 DIAGNOSIS — B9689 Other specified bacterial agents as the cause of diseases classified elsewhere: Secondary | ICD-10-CM

## 2019-06-21 NOTE — Progress Notes (Signed)
Patient states she's having vaginal irritation and discharge. Provided her steps to collect wet prep and patient collected without assistance. Advised to check mychart for results. Patient verbalized understanding.

## 2019-06-24 LAB — CERVICOVAGINAL ANCILLARY ONLY
Bacterial Vaginitis (gardnerella): POSITIVE — AB
Candida Glabrata: NEGATIVE
Candida Vaginitis: POSITIVE — AB
Chlamydia: NEGATIVE
Comment: NEGATIVE
Comment: NEGATIVE
Comment: NEGATIVE
Comment: NEGATIVE
Comment: NEGATIVE
Comment: NORMAL
Neisseria Gonorrhea: NEGATIVE
Trichomonas: NEGATIVE

## 2019-06-25 MED ORDER — TINIDAZOLE 500 MG PO TABS: 2.0000 g | ORAL_TABLET | Freq: Every day | ORAL | 3 refills | Status: DC

## 2019-06-25 MED ORDER — FLUCONAZOLE 150 MG PO TABS: 150.0000 mg | ORAL_TABLET | Freq: Once | ORAL | 3 refills | Status: AC

## 2019-06-25 MED FILL — TINIDAZOLE 500 MG TABS: 500 | 2 days supply | Qty: 8 | Fill #0

## 2019-06-25 MED FILL — FLUCONAZOLE 150 MG TABLET: 150 | 2 days supply | Qty: 2 | Fill #0

## 2019-06-25 NOTE — Addendum Note (Signed)
Addended by: Verita Schneiders A on: 06/25/2019 01:45 PM   Modules accepted: Orders

## 2019-06-27 ENCOUNTER — Other Ambulatory Visit: Payer: Self-pay

## 2019-06-27 ENCOUNTER — Ambulatory Visit (HOSPITAL_COMMUNITY)
Admission: RE | Admit: 2019-06-27 | Discharge: 2019-06-27 | Disposition: A | Discharge status: Home / Self Care | Payer: No Typology Code available for payment source | Source: Ambulatory Visit | Attending: Obstetrics & Gynecology | Admitting: Obstetrics & Gynecology

## 2019-06-27 DIAGNOSIS — N852 Hypertrophy of uterus: Secondary | ICD-10-CM | POA: Diagnosis not present

## 2019-07-01 ENCOUNTER — Telehealth: Payer: Self-pay | Admitting: Lactation Services

## 2019-07-01 NOTE — Telephone Encounter (Signed)
Pt informed her [place] results were normal. Pt asked if she has fibroids, informed her that Dr. Ihor Dow reports results are normal. Pt voiced understanding.

## 2019-07-25 ENCOUNTER — Other Ambulatory Visit (HOSPITAL_COMMUNITY)
Admission: RE | Admit: 2019-07-25 | Discharge: 2019-07-25 | Disposition: A | Discharge status: Home / Self Care | Payer: 59 | Source: Ambulatory Visit | Attending: Obstetrics and Gynecology | Admitting: Obstetrics and Gynecology

## 2019-07-25 ENCOUNTER — Other Ambulatory Visit: Payer: Self-pay

## 2019-07-25 DIAGNOSIS — N898 Other specified noninflammatory disorders of vagina: Secondary | ICD-10-CM | POA: Insufficient documentation

## 2019-07-25 NOTE — Progress Notes (Signed)
Presistant vaginal discharge. Wants self swab. Completed advised to check mychart for result in a few days.

## 2019-07-26 ENCOUNTER — Other Ambulatory Visit: Payer: Self-pay | Admitting: Obstetrics and Gynecology

## 2019-07-26 LAB — CERVICOVAGINAL ANCILLARY ONLY
Bacterial Vaginitis (gardnerella): POSITIVE — AB
Candida Glabrata: NEGATIVE
Candida Vaginitis: POSITIVE — AB
Chlamydia: NEGATIVE
Comment: NEGATIVE
Comment: NEGATIVE
Comment: NEGATIVE
Comment: NEGATIVE
Comment: NEGATIVE
Comment: NORMAL
Neisseria Gonorrhea: NEGATIVE
Trichomonas: NEGATIVE

## 2019-07-26 MED ORDER — METRONIDAZOLE 500 MG PO TABS: 500.0000 mg | ORAL_TABLET | Freq: Two times a day (BID) | ORAL | 0 refills | Status: AC

## 2019-08-09 ENCOUNTER — Other Ambulatory Visit: Payer: Self-pay | Admitting: Obstetrics and Gynecology

## 2019-08-09 MED ORDER — FLUCONAZOLE 150 MG PO TABS: 150.0000 mg | ORAL_TABLET | Freq: Every day | ORAL | 0 refills | Status: DC

## 2019-08-09 NOTE — Progress Notes (Signed)
+   yeast following treatment of Bv. Rx: Diflucan. 1 tab now and repeat in 3 days.   Venia Carbon I, NP 08/09/2019 9:30 AM

## 2019-09-06 ENCOUNTER — Other Ambulatory Visit: Payer: Self-pay

## 2019-09-06 DIAGNOSIS — Z3200 Encounter for pregnancy test, result unknown: Secondary | ICD-10-CM | POA: Diagnosis not present

## 2019-09-06 LAB — BETA HCG QUANT (REF LAB): hCG Quant: 233 m[IU]/mL

## 2019-09-06 NOTE — Progress Notes (Signed)
Stat beta HCG drawn for possible pregnancy. Results sent to Anyanwu, MD. Pt does not have any complaints.

## 2019-09-10 ENCOUNTER — Other Ambulatory Visit: Payer: Self-pay | Admitting: Obstetrics & Gynecology

## 2019-09-10 DIAGNOSIS — M79643 Pain in unspecified hand: Secondary | ICD-10-CM

## 2019-09-10 DIAGNOSIS — Z3200 Encounter for pregnancy test, result unknown: Secondary | ICD-10-CM

## 2019-09-10 MED ORDER — PREPLUS 27-1 MG PO TABS: 1.0000 | ORAL_TABLET | Freq: Every day | ORAL | 13 refills | Status: DC

## 2019-09-10 MED ORDER — CYCLOBENZAPRINE HCL 10 MG PO TABS: 10.0000 mg | ORAL_TABLET | Freq: Three times a day (TID) | ORAL | 3 refills | Status: DC | PRN

## 2019-09-10 MED ORDER — DOXYLAMINE SUCCINATE (SLEEP) 25 MG PO TABS: 25.0000 mg | ORAL_TABLET | Freq: Four times a day (QID) | ORAL | 2 refills | Status: DC | PRN

## 2019-09-12 MED FILL — PRENATAL FE 27-1 MG TAB: 27-1 | 30 days supply | Qty: 30 | Fill #0

## 2019-09-12 MED FILL — CYCLOBENZAPRINE HCL 10 MG T: 10 | 30 days supply | Qty: 90 | Fill #0

## 2019-09-16 ENCOUNTER — Other Ambulatory Visit: Payer: Self-pay | Admitting: Obstetrics & Gynecology

## 2019-09-16 DIAGNOSIS — O3680X0 Pregnancy with inconclusive fetal viability, not applicable or unspecified: Secondary | ICD-10-CM

## 2019-09-27 ENCOUNTER — Ambulatory Visit
Admission: RE | Admit: 2019-09-27 | Discharge: 2019-09-27 | Disposition: A | Discharge status: Home / Self Care | Payer: 59 | Source: Ambulatory Visit | Attending: Obstetrics & Gynecology | Admitting: Obstetrics & Gynecology

## 2019-09-27 ENCOUNTER — Other Ambulatory Visit: Payer: Self-pay | Admitting: Obstetrics & Gynecology

## 2019-09-27 ENCOUNTER — Other Ambulatory Visit: Payer: Self-pay

## 2019-09-27 DIAGNOSIS — O3680X0 Pregnancy with inconclusive fetal viability, not applicable or unspecified: Secondary | ICD-10-CM | POA: Insufficient documentation

## 2019-09-27 DIAGNOSIS — Z3A01 Less than 8 weeks gestation of pregnancy: Secondary | ICD-10-CM | POA: Diagnosis not present

## 2019-09-30 ENCOUNTER — Other Ambulatory Visit: Payer: Self-pay | Admitting: General Practice

## 2019-09-30 DIAGNOSIS — K5900 Constipation, unspecified: Secondary | ICD-10-CM

## 2019-09-30 DIAGNOSIS — O219 Vomiting of pregnancy, unspecified: Secondary | ICD-10-CM

## 2019-09-30 DIAGNOSIS — O99611 Diseases of the digestive system complicating pregnancy, first trimester: Secondary | ICD-10-CM

## 2019-09-30 MED ORDER — DOXYLAMINE-PYRIDOXINE 10-10 MG PO TBEC: 2.0000 | DELAYED_RELEASE_TABLET | Freq: Every day | ORAL | 2 refills | Status: DC

## 2019-09-30 MED ORDER — PROMETHAZINE HCL 25 MG PO TABS: 25.0000 mg | ORAL_TABLET | Freq: Four times a day (QID) | ORAL | 0 refills | Status: DC | PRN

## 2019-09-30 MED FILL — PROMETHAZINE 25 MG TABLET: 25 | 7 days supply | Qty: 30 | Fill #0

## 2019-09-30 NOTE — Progress Notes (Signed)
Patient reports nausea and vomiting in early pregnancy. Diclegis sent in per protocol.

## 2019-10-01 MED ORDER — DOCUSATE SODIUM 100 MG PO CAPS: 100.0000 mg | ORAL_CAPSULE | Freq: Two times a day (BID) | ORAL | 1 refills | Status: DC | PRN

## 2019-10-06 MED FILL — PRENATAL FE 27-1 MG TAB: 27-1 | 30 days supply | Qty: 30 | Fill #1

## 2019-10-24 ENCOUNTER — Other Ambulatory Visit: Payer: Self-pay

## 2019-10-24 ENCOUNTER — Encounter: Payer: Self-pay | Admitting: Obstetrics & Gynecology

## 2019-10-24 ENCOUNTER — Ambulatory Visit (INDEPENDENT_AMBULATORY_CARE_PROVIDER_SITE_OTHER): Payer: 59 | Admitting: Obstetrics & Gynecology

## 2019-10-24 DIAGNOSIS — O34219 Maternal care for unspecified type scar from previous cesarean delivery: Secondary | ICD-10-CM

## 2019-10-24 DIAGNOSIS — Z302 Encounter for sterilization: Secondary | ICD-10-CM

## 2019-10-24 DIAGNOSIS — Z3480 Encounter for supervision of other normal pregnancy, unspecified trimester: Secondary | ICD-10-CM

## 2019-10-24 DIAGNOSIS — Z3A10 10 weeks gestation of pregnancy: Secondary | ICD-10-CM

## 2019-10-24 DIAGNOSIS — T07XXXA Unspecified multiple injuries, initial encounter: Secondary | ICD-10-CM

## 2019-10-24 DIAGNOSIS — O09291 Supervision of pregnancy with other poor reproductive or obstetric history, first trimester: Secondary | ICD-10-CM

## 2019-10-24 DIAGNOSIS — Z98891 History of uterine scar from previous surgery: Secondary | ICD-10-CM

## 2019-10-24 MED ORDER — PREPLUS 27-1 MG PO TABS: 1.0000 | ORAL_TABLET | Freq: Every day | ORAL | 13 refills | Status: AC

## 2019-10-24 NOTE — Patient Instructions (Addendum)
First Trimester of Pregnancy The first trimester of pregnancy is from week 1 until the end of week 13 (months 1 through 3). A week after a sperm fertilizes an egg, the egg will implant on the wall of the uterus. This embryo will begin to develop into a baby. Genes from you and your partner will form the baby. The female genes will determine whether the baby will be a boy or a girl. At 6-8 weeks, the eyes and face will be formed, and the heartbeat can be seen on ultrasound. At the end of 12 weeks, all the baby's organs will be formed. Now that you are pregnant, you will want to do everything you can to have a healthy baby. Two of the most important things are to get good prenatal care and to follow your health care provider's instructions. Prenatal care is all the medical care you receive before the baby's birth. This care will help prevent, find, and treat any problems during the pregnancy and childbirth. Body changes during your first trimester Your body goes through many changes during pregnancy. The changes vary from woman to woman.  You may gain or lose a couple of pounds at first.  You may feel sick to your stomach (nauseous) and you may throw up (vomit). If the vomiting is uncontrollable, call your health care provider.  You may tire easily.  You may develop headaches that can be relieved by medicines. All medicines should be approved by your health care provider.  You may urinate more often. Painful urination may mean you have a bladder infection.  You may develop heartburn as a result of your pregnancy.  You may develop constipation because certain hormones are causing the muscles that push stool through your intestines to slow down.  You may develop hemorrhoids or swollen veins (varicose veins).  Your breasts may begin to grow larger and become tender. Your nipples may stick out more, and the tissue that surrounds them (areola) may become darker.  Your gums may bleed and may be  sensitive to brushing and flossing.  Dark spots or blotches (chloasma, mask of pregnancy) may develop on your face. This will likely fade after the baby is born.  Your menstrual periods will stop.  You may have a loss of appetite.  You may develop cravings for certain kinds of food.  You may have changes in your emotions from day to day, such as being excited to be pregnant or being concerned that something may go wrong with the pregnancy and baby.  You may have more vivid and strange dreams.  You may have changes in your hair. These can include thickening of your hair, rapid growth, and changes in texture. Some women also have hair loss during or after pregnancy, or hair that feels dry or thin. Your hair will most likely return to normal after your baby is born. What to expect at prenatal visits During a routine prenatal visit:  You will be weighed to make sure you and the baby are growing normally.  Your blood pressure will be taken.  Your abdomen will be measured to track your baby's growth.  The fetal heartbeat will be listened to between weeks 10 and 14 of your pregnancy.  Test results from any previous visits will be discussed. Your health care provider may ask you:  How you are feeling.  If you are feeling the baby move.  If you have had any abnormal symptoms, such as leaking fluid, bleeding, severe headaches, or abdominal   cramping.  If you are using any tobacco products, including cigarettes, chewing tobacco, and electronic cigarettes.  If you have any questions. Other tests that may be performed during your first trimester include:  Blood tests to find your blood type and to check for the presence of any previous infections. The tests will also be used to check for low iron levels (anemia) and protein on red blood cells (Rh antibodies). Depending on your risk factors, or if you previously had diabetes during pregnancy, you may have tests to check for high blood sugar  that affects pregnant women (gestational diabetes).  Urine tests to check for infections, diabetes, or protein in the urine.  An ultrasound to confirm the proper growth and development of the baby.  Fetal screens for spinal cord problems (spina bifida) and Down syndrome.  HIV (human immunodeficiency virus) testing. Routine prenatal testing includes screening for HIV, unless you choose not to have this test.  You may need other tests to make sure you and the baby are doing well. Follow these instructions at home: Medicines  Follow your health care provider's instructions regarding medicine use. Specific medicines may be either safe or unsafe to take during pregnancy.  Take a prenatal vitamin that contains at least 600 micrograms (mcg) of folic acid.  If you develop constipation, try taking a stool softener if your health care provider approves. Eating and drinking   Eat a balanced diet that includes fresh fruits and vegetables, whole grains, good sources of protein such as meat, eggs, or tofu, and low-fat dairy. Your health care provider will help you determine the amount of weight gain that is right for you.  Avoid raw meat and uncooked cheese. These carry germs that can cause birth defects in the baby.  Eating four or five small meals rather than three large meals a day may help relieve nausea and vomiting. If you start to feel nauseous, eating a few soda crackers can be helpful. Drinking liquids between meals, instead of during meals, also seems to help ease nausea and vomiting.  Limit foods that are high in fat and processed sugars, such as fried and sweet foods.  To prevent constipation: ? Eat foods that are high in fiber, such as fresh fruits and vegetables, whole grains, and beans. ? Drink enough fluid to keep your urine clear or pale yellow. Activity  Exercise only as directed by your health care provider. Most women can continue their usual exercise routine during  pregnancy. Try to exercise for 30 minutes at least 5 days a week. Exercising will help you: ? Control your weight. ? Stay in shape. ? Be prepared for labor and delivery.  Experiencing pain or cramping in the lower abdomen or lower back is a good sign that you should stop exercising. Check with your health care provider before continuing with normal exercises.  Try to avoid standing for long periods of time. Move your legs often if you must stand in one place for a long time.  Avoid heavy lifting.  Wear low-heeled shoes and practice good posture.  You may continue to have sex unless your health care provider tells you not to. Relieving pain and discomfort  Wear a good support bra to relieve breast tenderness.  Take warm sitz baths to soothe any pain or discomfort caused by hemorrhoids. Use hemorrhoid cream if your health care provider approves.  Rest with your legs elevated if you have leg cramps or low back pain.  If you develop varicose veins in   your legs, wear support hose. Elevate your feet for 15 minutes, 3-4 times a day. Limit salt in your diet. Prenatal care  Schedule your prenatal visits by the twelfth week of pregnancy. They are usually scheduled monthly at first, then more often in the last 2 months before delivery.  Write down your questions. Take them to your prenatal visits.  Keep all your prenatal visits as told by your health care provider. This is important. Safety  Wear your seat belt at all times when driving.  Make a list of emergency phone numbers, including numbers for family, friends, the hospital, and police and fire departments. General instructions  Ask your health care provider for a referral to a local prenatal education class. Begin classes no later than the beginning of month 6 of your pregnancy.  Ask for help if you have counseling or nutritional needs during pregnancy. Your health care provider can offer advice or refer you to specialists for help  with various needs.  Do not use hot tubs, steam rooms, or saunas.  Do not douche or use tampons or scented sanitary pads.  Do not cross your legs for long periods of time.  Avoid cat litter boxes and soil used by cats. These carry germs that can cause birth defects in the baby and possibly loss of the fetus by miscarriage or stillbirth.  Avoid all smoking, herbs, alcohol, and medicines not prescribed by your health care provider. Chemicals in these products affect the formation and growth of the baby.  Do not use any products that contain nicotine or tobacco, such as cigarettes and e-cigarettes. If you need help quitting, ask your health care provider. You may receive counseling support and other resources to help you quit.  Schedule a dentist appointment. At home, brush your teeth with a soft toothbrush and be gentle when you floss. Contact a health care provider if:  You have dizziness.  You have mild pelvic cramps, pelvic pressure, or nagging pain in the abdominal area.  You have persistent nausea, vomiting, or diarrhea.  You have a bad smelling vaginal discharge.  You have pain when you urinate.  You notice increased swelling in your face, hands, legs, or ankles.  You are exposed to fifth disease or chickenpox.  You are exposed to German measles (rubella) and have never had it. Get help right away if:  You have a fever.  You are leaking fluid from your vagina.  You have spotting or bleeding from your vagina.  You have severe abdominal cramping or pain.  You have rapid weight gain or loss.  You vomit blood or material that looks like coffee grounds.  You develop a severe headache.  You have shortness of breath.  You have any kind of trauma, such as from a fall or a car accident. Summary  The first trimester of pregnancy is from week 1 until the end of week 13 (months 1 through 3).  Your body goes through many changes during pregnancy. The changes vary from  woman to woman.  You will have routine prenatal visits. During those visits, your health care provider will examine you, discuss any test results you may have, and talk with you about how you are feeling. This information is not intended to replace advice given to you by your health care provider. Make sure you discuss any questions you have with your health care provider. Document Revised: 06/16/2017 Document Reviewed: 06/15/2016 Elsevier Patient Education  2020 Elsevier Inc.   Vaginal Birth After Cesarean   Delivery  Vaginal birth after cesarean delivery (VBAC) is giving birth vaginally after previously delivering a baby through a cesarean section (C-section). A VBAC may be a safe option for you, depending on your health and other factors. It is important to discuss VBAC with your health care provider early in your pregnancy so you can understand the risks, benefits, and options. Having these discussions early will give you time to make your birth plan. Who are the best candidates for VBAC? The best candidates for VBAC are women who:  Have had one or two prior cesarean deliveries, and the incision made during the delivery was horizontal (low transverse).  Do not have a vertical (classical) scar on their uterus.  Have not had a tear in the wall of their uterus (uterine rupture).  Plan to have more pregnancies. A VBAC is also more likely to be successful:  In women who have previously given birth vaginally.  When labor starts by itself (spontaneously) before the due date. What are the benefits of VBAC? The benefits of delivering your baby vaginally instead of by a cesarean delivery include:  A shorter hospital stay.  A faster recovery time.  Less pain.  Avoiding risks associated with major surgery, such as infection and blood clots.  Less blood loss and less need for donated blood (transfusions). What are the risks of VBAC? The main risk of attempting a VBAC is that it may  fail, forcing your health care provider to deliver your baby by a C-section. Other risks are rare and include:  Tearing (rupture) of the scar from a past cesarean delivery.  Other risks associated with vaginal deliveries. If a repeat cesarean delivery is needed, the risks include:  Blood loss.  Infection.  Blood clot.  Damage to surrounding organs.  Removal of the uterus (hysterectomy), if it is damaged.  Placenta problems in future pregnancies. What else should I know about my options? Delivering a baby through a VBAC is similar to having a normal spontaneous vaginal delivery. Therefore, it is safe:  To try with twins.  For your health care provider to try to turn the baby from a breech position (external cephalic version) during labor.  With epidural analgesia for pain relief. Consider where you would like to deliver your baby. VBAC should be attempted in facilities where an emergency cesarean delivery can be performed. VBAC is not recommended for home births. Any changes in your health or your baby's health during your pregnancy may make it necessary to change your initial decision about VBAC. Your health care provider may recommend that you do not attempt a VBAC if:  Your baby's suspected weight is 8.8 lb (4 kg) or more.  You have preeclampsia. This is a condition that causes high blood pressure along with other symptoms, such as swelling and headaches.  You will have VBAC less than 19 months after your cesarean delivery.  You are past your due date.  You need to have labor started (induced) because your cervix is not ready for labor (unfavorable). Where to find more information  American Pregnancy Association: americanpregnancy.org  American Congress of Obstetricians and Gynecologists: acog.org Summary  Vaginal birth after cesarean delivery (VBAC) is giving birth vaginally after previously delivering a baby through a cesarean section (C-section). A VBAC may be a  safe option for you, depending on your health and other factors.  Discuss VBAC with your health care provider early in your pregnancy so you can understand the risks, benefits, options, and have plenty of   time to make your birth plan.  The main risk of attempting a VBAC is that it may fail, forcing your health care provider to deliver your baby by a C-section. Other risks are rare. This information is not intended to replace advice given to you by your health care provider. Make sure you discuss any questions you have with your health care provider. Document Revised: 10/30/2018 Document Reviewed: 10/11/2016 Elsevier Patient Education  2020 Elsevier Inc.  
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
## 2019-10-25 LAB — OBSTETRIC PANEL, INCLUDING HIV
Antibody Screen: NEGATIVE
Basophils Absolute: 0 10*3/uL (ref 0.0–0.2)
Basos: 1 %
EOS (ABSOLUTE): 0.1 10*3/uL (ref 0.0–0.4)
Eos: 2 %
HIV Screen 4th Generation wRfx: NONREACTIVE
Hematocrit: 36.7 % (ref 34.0–46.6)
Hemoglobin: 11.5 g/dL (ref 11.1–15.9)
Hepatitis B Surface Ag: NEGATIVE
Immature Grans (Abs): 0 10*3/uL (ref 0.0–0.1)
Immature Granulocytes: 0 %
Lymphocytes Absolute: 1.4 10*3/uL (ref 0.7–3.1)
Lymphs: 25 %
MCH: 23.4 pg — ABNORMAL LOW (ref 26.6–33.0)
MCHC: 31.3 g/dL — ABNORMAL LOW (ref 31.5–35.7)
MCV: 75 fL — ABNORMAL LOW (ref 79–97)
Monocytes Absolute: 0.3 10*3/uL (ref 0.1–0.9)
Monocytes: 6 %
Neutrophils Absolute: 3.6 10*3/uL (ref 1.4–7.0)
Neutrophils: 66 %
Platelets: 297 10*3/uL (ref 150–450)
RBC: 4.92 x10E6/uL (ref 3.77–5.28)
RDW: 15.2 % (ref 11.7–15.4)
RPR Ser Ql: NONREACTIVE
Rh Factor: POSITIVE
Rubella Antibodies, IGG: 2.61 index (ref >0.99)
WBC: 5.5 10*3/uL (ref 3.4–10.8)

## 2019-10-25 LAB — COMPREHENSIVE METABOLIC PANEL
ALT: 12 IU/L (ref 0–32)
AST: 16 IU/L (ref 0–40)
Albumin/Globulin Ratio: 1.2 (ref 1.2–2.2)
Albumin: 3.9 g/dL (ref 3.8–4.8)
Alkaline Phosphatase: 55 IU/L (ref 39–117)
BUN/Creatinine Ratio: 13 (ref 9–23)
BUN: 11 mg/dL (ref 6–20)
Bilirubin Total: 0.4 mg/dL (ref 0.0–1.2)
CO2: 21 mmol/L (ref 20–29)
Calcium: 9.1 mg/dL (ref 8.7–10.2)
Chloride: 101 mmol/L (ref 96–106)
Creatinine, Ser: 0.83 mg/dL (ref 0.57–1.00)
GFR calc Af Amer: 106 mL/min/{1.73_m2} (ref >59)
GFR calc non Af Amer: 92 mL/min/{1.73_m2} (ref >59)
Globulin, Total: 3.2 g/dL (ref 1.5–4.5)
Glucose: 83 mg/dL (ref 65–99)
Potassium: 4.1 mmol/L (ref 3.5–5.2)
Sodium: 136 mmol/L (ref 134–144)
Total Protein: 7.1 g/dL (ref 6.0–8.5)

## 2019-10-25 LAB — CERVICOVAGINAL ANCILLARY ONLY
Bacterial Vaginitis (gardnerella): NEGATIVE
Candida Glabrata: NEGATIVE
Candida Vaginitis: NEGATIVE
Chlamydia: NEGATIVE
Comment: NEGATIVE
Comment: NEGATIVE
Comment: NEGATIVE
Comment: NEGATIVE
Comment: NEGATIVE
Comment: NORMAL
Neisseria Gonorrhea: NEGATIVE
Trichomonas: NEGATIVE

## 2019-10-25 LAB — TSH: TSH: 2.4 u[IU]/mL (ref 0.450–4.500)

## 2019-10-25 LAB — HEMOGLOBIN A1C
Est. average glucose Bld gHb Est-mCnc: 105 mg/dL
Hgb A1c MFr Bld: 5.3 % (ref 4.8–5.6)

## 2019-10-25 LAB — HCV COMMENT:

## 2019-10-25 LAB — HEPATITIS C ANTIBODY (REFLEX): HCV Ab: <0.1 s/co ratio (ref 0.0–0.9)

## 2019-10-26 LAB — CULTURE, OB URINE

## 2019-10-26 LAB — URINE CULTURE, OB REFLEX

## 2019-10-30 ENCOUNTER — Telehealth: Payer: Self-pay | Admitting: Radiology

## 2019-10-30 ENCOUNTER — Encounter: Payer: Self-pay | Admitting: Radiology

## 2019-10-30 NOTE — Telephone Encounter (Signed)
Patient informed of Panorama results but will pick up fetal sex from front desk

## 2019-10-30 NOTE — Telephone Encounter (Signed)
Left message for patient to call [facility] for panorama results

## 2019-11-04 ENCOUNTER — Encounter: Payer: Self-pay | Admitting: *Deleted

## 2019-11-04 ENCOUNTER — Encounter: Payer: Self-pay | Admitting: Obstetrics & Gynecology

## 2019-11-04 ENCOUNTER — Telehealth: Payer: Self-pay | Admitting: *Deleted

## 2019-11-04 DIAGNOSIS — D563 Thalassemia minor: Secondary | ICD-10-CM | POA: Insufficient documentation

## 2019-11-04 NOTE — Telephone Encounter (Signed)
Pt informed of [facility] results, partner information received and order placed through [place] for partner screen.

## 2019-11-12 ENCOUNTER — Ambulatory Visit (INDEPENDENT_AMBULATORY_CARE_PROVIDER_SITE_OTHER): Payer: Medicaid Other | Admitting: *Deleted

## 2019-11-12 ENCOUNTER — Other Ambulatory Visit: Payer: Self-pay

## 2019-11-12 VITALS — BP 117/76 | HR 91

## 2019-11-12 DIAGNOSIS — Z3480 Encounter for supervision of other normal pregnancy, unspecified trimester: Secondary | ICD-10-CM

## 2019-11-12 NOTE — Progress Notes (Signed)
Patient seen and assessed by nursing staff during this encounter. I have reviewed the chart and agree with the documentation and plan.  Jaynie Collins, MD 11/12/2019 10:18 AM

## 2019-11-12 NOTE — Progress Notes (Signed)
Pt came by office to do a fetal check. Pt had some bleeding this morning, which has subsided currently. Bedside ultrasound placed, Fetal activity noted and FHR 158.   Discussed bleeding precautions.

## 2019-11-20 ENCOUNTER — Other Ambulatory Visit: Payer: Self-pay | Admitting: *Deleted

## 2019-11-20 MED ORDER — TERCONAZOLE 0.8 % VA CREA: 1.0000 | TOPICAL_CREAM | Freq: Every day | VAGINAL | 0 refills | Status: DC

## 2019-11-21 ENCOUNTER — Other Ambulatory Visit: Payer: Self-pay

## 2019-11-21 ENCOUNTER — Ambulatory Visit (INDEPENDENT_AMBULATORY_CARE_PROVIDER_SITE_OTHER): Payer: Medicaid Other | Admitting: Obstetrics & Gynecology

## 2019-11-21 ENCOUNTER — Encounter: Payer: Self-pay | Admitting: Obstetrics & Gynecology

## 2019-11-21 VITALS — BP 104/78 | HR 78 | Wt 189.9 lb

## 2019-11-21 DIAGNOSIS — K5900 Constipation, unspecified: Secondary | ICD-10-CM

## 2019-11-21 DIAGNOSIS — O99612 Diseases of the digestive system complicating pregnancy, second trimester: Secondary | ICD-10-CM

## 2019-11-21 DIAGNOSIS — O23592 Infection of other part of genital tract in pregnancy, second trimester: Secondary | ICD-10-CM

## 2019-11-21 DIAGNOSIS — Z3480 Encounter for supervision of other normal pregnancy, unspecified trimester: Secondary | ICD-10-CM

## 2019-11-21 DIAGNOSIS — N760 Acute vaginitis: Secondary | ICD-10-CM

## 2019-11-21 MED ORDER — POLYETHYLENE GLYCOL 3350 17 G PO PACK: 17.0000 g | PACK | Freq: Every day | ORAL | 5 refills | Status: DC | PRN

## 2019-11-21 MED ORDER — TERCONAZOLE 0.8 % VA CREA: 1.0000 | TOPICAL_CREAM | Freq: Every day | VAGINAL | 1 refills | Status: DC

## 2019-11-21 NOTE — Patient Instructions (Signed)

## 2019-11-21 NOTE — Progress Notes (Signed)
   PRENATAL VISIT NOTE  Subjective:  Jennifer Ross is a 34 y.o. [id number] at [redacted]w[redacted]d being seen today for ongoing prenatal care.  She is currently monitored for the following issues for this low-risk pregnancy and has Supervision of other normal pregnancy, antepartum; History of low transverse cesarean section; Request for sterilization; Fractures involving multiple body regions; and Thalassemia alpha carrier on their problem list.  Patient reports continue vulvar irritation despite recent treeatment for BV (confirmed on testing).  Also reports severe constipation despite Colace and hydration.   .  .  Movement: Absent. Denies leaking of fluid.   The following portions of the patient's history were reviewed and updated as appropriate: allergies, current medications, past family history, past medical history, past social history, past surgical history and problem list.   Objective:   Vitals:   11/21/19 1401  BP: 104/78  Pulse: 78  Weight: 189 lb 14.4 oz (86.1 kg)    Fetal Status: Fetal Heart Rate (bpm): 161   Movement: Absent     General:  Alert, oriented and cooperative. Patient is in no acute distress.  Skin: Skin is warm and dry. No rash noted.   Cardiovascular: Normal heart rate noted  Respiratory: Normal respiratory effort, no problems with respiration noted  Abdomen: Soft, gravid, appropriate for gestational age.  Pain/Pressure: Absent     Pelvic: Cervical exam deferred        Extremities: Normal range of motion.  Edema: None  Mental Status: Normal mood and affect. Normal behavior. Normal judgment and thought content.   Assessment and Plan:  Pregnancy: [id number] at [redacted]w[redacted]d 1. Constipation during pregnancy in second trimester Will add Miralax to Colace, emphasized adequate hydration. - polyethylene glycol (MIRALAX / GLYCOLAX) 17 g packet; Take 17 g by mouth daily as needed for moderate constipation or severe constipation. Can use twice a day if needed  Dispense: 14 each; Refill: 5  2.  Vaginitis affecting pregnancy in second trimester, antepartum Antifungal prescribed for likely rebound yeast infection. - terconazole (TERAZOL 3) 0.8 % vaginal cream; Place 1 applicator vaginally at bedtime. Apply nightly for three nights.  Dispense: 20 g; Refill: 1  3. Supervision of other normal pregnancy, antepartum Low risk NIPS, scheduled for anatomy scan. No other complaints or concerns.  Routine obstetric precautions reviewed. Please refer to After Visit Summary for other counseling recommendations.   Return in about 4 weeks (around 12/19/2019) for OFFICE OB Visit.  Future Appointments  Date Time Provider Department Center  12/19/2019  9:15 AM [place] Bing, MD [facility]  12/23/2019  1:45 PM [facility]    Jaynie Collins, MD

## 2019-12-19 ENCOUNTER — Other Ambulatory Visit: Payer: Self-pay

## 2019-12-19 ENCOUNTER — Ambulatory Visit (INDEPENDENT_AMBULATORY_CARE_PROVIDER_SITE_OTHER): Payer: Medicaid Other | Admitting: Obstetrics and Gynecology

## 2019-12-19 VITALS — BP 114/71 | HR 85 | Wt 195.0 lb

## 2019-12-19 DIAGNOSIS — Z3A18 18 weeks gestation of pregnancy: Secondary | ICD-10-CM

## 2019-12-19 DIAGNOSIS — O09522 Supervision of elderly multigravida, second trimester: Secondary | ICD-10-CM

## 2019-12-19 DIAGNOSIS — Z3480 Encounter for supervision of other normal pregnancy, unspecified trimester: Secondary | ICD-10-CM

## 2019-12-19 DIAGNOSIS — Z98891 History of uterine scar from previous surgery: Secondary | ICD-10-CM

## 2019-12-19 DIAGNOSIS — O09529 Supervision of elderly multigravida, unspecified trimester: Secondary | ICD-10-CM | POA: Insufficient documentation

## 2019-12-19 NOTE — Progress Notes (Signed)
Prenatal Visit Note Date: 12/19/2019 Clinic: [facility]  Subjective:  Jennifer Ross is a 34 y.o. [id number] at [redacted]w[redacted]d being seen today for ongoing prenatal care.  She is currently monitored for the following issues for this high-risk pregnancy and has Supervision of other normal pregnancy, antepartum; History of low transverse cesarean section; Request for sterilization; Fractures involving multiple body regions; Thalassemia alpha carrier; and AMA (advanced maternal age) multigravida 35+ on their problem list.  Patient reports no complaints.   Contractions: Not present. Vag. Bleeding: None.  Movement: Present. Denies leaking of fluid.   The following portions of the patient's history were reviewed and updated as appropriate: allergies, current medications, past family history, past medical history, past social history, past surgical history and problem list. Problem list updated.  Objective:   Vitals:   12/19/19 0919  BP: 114/71  Pulse: 85  Weight: 195 lb (88.5 kg)    Fetal Status: Fetal Heart Rate (bpm): 147   Movement: Present     General:  Alert, oriented and cooperative. Patient is in no acute distress.  Skin: Skin is warm and dry. No rash noted.   Cardiovascular: Normal heart rate noted  Respiratory: Normal respiratory effort, no problems with respiration noted  Abdomen: Soft, gravid, appropriate for gestational age. Pain/Pressure: Absent     Pelvic:  Cervical exam deferred        Extremities: Normal range of motion.  Edema: None  Mental Status: Normal mood and affect. Normal behavior. Normal judgment and thought content.   Urinalysis:      Assessment and Plan:  Pregnancy: [id number] at [redacted]w[redacted]d  1. Supervision of other normal pregnancy, antepartum Routine care. Anatomy u/s already scheduled, sign BTL form nv - AFP, Serum, Open Spina Bifida  2. [redacted] weeks gestation of pregnancy  3. Multigravida of advanced maternal age in second trimester Normal cffdna -  AFP, Serum, Open Spina Bifida  4. History of low transverse cesarean section Discuss more re: tolac nv, sign form prn  Preterm labor symptoms and general obstetric precautions including but not limited to vaginal bleeding, contractions, leaking of fluid and fetal movement were reviewed in detail with the patient. Please refer to After Visit Summary for other counseling recommendations.  Return in about 1 month (around 01/18/2020) for low risk, virtual visit.   Tibbie Bing, MD

## 2019-12-21 LAB — AFP, SERUM, OPEN SPINA BIFIDA
AFP MoM: 1.18
AFP Value: 51.7 ng/mL
Gest. Age on Collection Date: 18.4 weeks
Maternal Age At EDD: 35.1 yr
OSBR Risk 1 IN: 10000
Test Results:: NEGATIVE
Weight: 195 [lb_av]

## 2019-12-23 ENCOUNTER — Ambulatory Visit (HOSPITAL_COMMUNITY): Payer: Medicaid Other | Attending: Obstetrics and Gynecology

## 2019-12-23 ENCOUNTER — Other Ambulatory Visit: Payer: Self-pay | Admitting: Obstetrics & Gynecology

## 2019-12-23 ENCOUNTER — Other Ambulatory Visit: Payer: Self-pay

## 2019-12-23 DIAGNOSIS — Z3480 Encounter for supervision of other normal pregnancy, unspecified trimester: Secondary | ICD-10-CM | POA: Diagnosis present

## 2019-12-23 DIAGNOSIS — Z363 Encounter for antenatal screening for malformations: Secondary | ICD-10-CM

## 2019-12-23 DIAGNOSIS — Z3A19 19 weeks gestation of pregnancy: Secondary | ICD-10-CM

## 2019-12-23 DIAGNOSIS — Z148 Genetic carrier of other disease: Secondary | ICD-10-CM

## 2019-12-23 DIAGNOSIS — O09522 Supervision of elderly multigravida, second trimester: Secondary | ICD-10-CM | POA: Diagnosis not present

## 2019-12-23 DIAGNOSIS — O34219 Maternal care for unspecified type scar from previous cesarean delivery: Secondary | ICD-10-CM | POA: Diagnosis not present

## 2019-12-24 ENCOUNTER — Other Ambulatory Visit: Payer: Self-pay | Admitting: *Deleted

## 2019-12-24 DIAGNOSIS — O09529 Supervision of elderly multigravida, unspecified trimester: Secondary | ICD-10-CM

## 2019-12-30 ENCOUNTER — Other Ambulatory Visit: Payer: Self-pay | Admitting: Obstetrics and Gynecology

## 2019-12-30 DIAGNOSIS — K649 Unspecified hemorrhoids: Secondary | ICD-10-CM | POA: Insufficient documentation

## 2019-12-30 DIAGNOSIS — O99612 Diseases of the digestive system complicating pregnancy, second trimester: Secondary | ICD-10-CM | POA: Insufficient documentation

## 2019-12-30 DIAGNOSIS — K5900 Constipation, unspecified: Secondary | ICD-10-CM | POA: Insufficient documentation

## 2019-12-30 MED ORDER — HYDROCORT-PRAMOXINE (PERIANAL) 2.5-1 % EX CREA: 1.0000 "application " | TOPICAL_CREAM | Freq: Three times a day (TID) | CUTANEOUS | 1 refills | Status: DC

## 2019-12-30 MED ORDER — POLYETHYLENE GLYCOL 3350 17 G PO PACK: 17.0000 g | PACK | Freq: Three times a day (TID) | ORAL | 1 refills | Status: DC

## 2020-01-16 ENCOUNTER — Encounter: Payer: Self-pay | Admitting: Obstetrics & Gynecology

## 2020-01-16 ENCOUNTER — Telehealth (INDEPENDENT_AMBULATORY_CARE_PROVIDER_SITE_OTHER): Payer: Medicaid Other | Admitting: Obstetrics & Gynecology

## 2020-01-16 DIAGNOSIS — Z98891 History of uterine scar from previous surgery: Secondary | ICD-10-CM

## 2020-01-16 DIAGNOSIS — Z3A22 22 weeks gestation of pregnancy: Secondary | ICD-10-CM

## 2020-01-16 DIAGNOSIS — Z3480 Encounter for supervision of other normal pregnancy, unspecified trimester: Secondary | ICD-10-CM

## 2020-01-16 DIAGNOSIS — Z3482 Encounter for supervision of other normal pregnancy, second trimester: Secondary | ICD-10-CM

## 2020-01-16 NOTE — Patient Instructions (Addendum)
Return to office for any scheduled appointments. Call the office or go to the MAU at [facility] if:  You begin to have strong, frequent contractions  Your water breaks.  Sometimes it is a big gush of fluid, sometimes it is just a trickle that keeps getting your panties wet or running down your legs  You have vaginal bleeding.  It is normal to have a small amount of spotting if your cervix was checked.   You do not feel your baby moving like normal.  If you do not, get something to eat and drink and lay down and focus on feeling your baby move.   If your baby is still not moving like normal, you should call the office or go to MAU.  Any other obstetric concerns.   Vaginal Birth After Cesarean Delivery  Vaginal birth after cesarean delivery (VBAC) is giving birth vaginally after previously delivering a baby through a cesarean section (C-section). A VBAC may be a safe option for you, depending on your health and other factors. It is important to discuss VBAC with your health care provider early in your pregnancy so you can understand the risks, benefits, and options. Having these discussions early will give you time to make your birth plan. Who are the best candidates for VBAC? The best candidates for VBAC are women who:  Have had one or two prior cesarean deliveries, and the incision made during the delivery was horizontal (low transverse).  Do not have a vertical (classical) scar on their uterus.  Have not had a tear in the wall of their uterus (uterine rupture).  Plan to have more pregnancies. A VBAC is also more likely to be successful:  In women who have previously given birth vaginally.  When labor starts by itself (spontaneously) before the due date. What are the benefits of VBAC? The benefits of delivering your baby vaginally instead of by a cesarean delivery include:  A shorter hospital stay.  A faster recovery time.  Less pain.  Avoiding  risks associated with major surgery, such as infection and blood clots.  Less blood loss and less need for donated blood (transfusions). What are the risks of VBAC? The main risk of attempting a VBAC is that it may fail, forcing your health care provider to deliver your baby by a C-section. Other risks are rare and include:  Tearing (rupture) of the scar from a past cesarean delivery.  Other risks associated with vaginal deliveries. If a repeat cesarean delivery is needed, the risks include:  Blood loss.  Infection.  Blood clot.  Damage to surrounding organs.  Removal of the uterus (hysterectomy), if it is damaged.  Placenta problems in future pregnancies. What else should I know about my options? Delivering a baby through a VBAC is similar to having a normal spontaneous vaginal delivery. Therefore, it is safe:  To try with twins.  For your health care provider to try to turn the baby from a breech position (external cephalic version) during labor.  With epidural analgesia for pain relief. Consider where you would like to deliver your baby. VBAC should be attempted in facilities where an emergency cesarean delivery can be performed. VBAC is not recommended for home births. Any changes in your health or your baby's health during your pregnancy may make it necessary to change your initial decision about VBAC. Your health care provider may recommend that you do not attempt a VBAC if:  Your baby's suspected weight is  8.8 lb (4 kg) or more.  You have preeclampsia. This is a condition that causes high blood pressure along with other symptoms, such as swelling and headaches.  You will have VBAC less than 19 months after your cesarean delivery.  You are past your due date.  You need to have labor started (induced) because your cervix is not ready for labor (unfavorable). Where to find more information  American Pregnancy Association: americanpregnancy.org  American Congress of  Obstetricians and Gynecologists: acog.org Summary  Vaginal birth after cesarean delivery (VBAC) is giving birth vaginally after previously delivering a baby through a cesarean section (C-section). A VBAC may be a safe option for you, depending on your health and other factors.  Discuss VBAC with your health care provider early in your pregnancy so you can understand the risks, benefits, options, and have plenty of time to make your birth plan.  The main risk of attempting a VBAC is that it may fail, forcing your health care provider to deliver your baby by a C-section. Other risks are rare. This information is not intended to replace advice given to you by your health care provider. Make sure you discuss any questions you have with your health care provider. Document Revised: 10/30/2018 Document Reviewed: 10/11/2016 Elsevier Patient Education  2020 Elsevier Inc.   Postpartum Tubal Ligation Postpartum tubal ligation (PPTL) is a procedure to close the fallopian tubes. This is done so that you cannot get pregnant. When the fallopian tubes are closed, the eggs that the ovaries release cannot enter the uterus, and sperm cannot reach the eggs. PPTL is done right after childbirth or 1-2 days after childbirth, before the uterus returns to its normal location. If you have a cesarean section, it can be performed at the same time as the procedure. Having this done after childbirth does not make your stay in the hospital longer. PPTL is sometimes called "getting your tubes tied." You should not have this procedure if you want to get pregnant again or if you are unsure about having more children. Tell a health care provider about:  Any allergies you have.  All medicines you are taking, including vitamins, herbs, eye drops, creams, and over-the-counter medicines.  Any problems you or family members have had with anesthetic medicines.  Any blood disorders you have.  Any surgeries you have had.  Any  medical conditions you have or have had.  Any past pregnancies. What are the risks? Generally, this is a safe procedure. However, problems may occur, including:  Infection.  Bleeding.  Injury to other organs in the abdomen.  Side effects from anesthetic medicines.  Failure of the procedure. If this happens, you could get pregnant.  Having a fertilized egg attach outside the uterus (ectopic pregnancy). What happens before the procedure?  Ask your health care provider about: ? How much pain you can expect to have. ? What medicines you will be given for pain, especially if you are planning to breastfeed. What happens during the procedure? If you had a vaginal delivery:  You will be given one or more of the following: ? A medicine to help you relax (sedative). ? A medicine to numb the area (local anesthetic). ? A medicine to make you fall asleep (general anesthetic). ? A medicine that is injected into an area of your body to numb everything below the injection site (regional anesthetic).  If you have been given a general anesthetic, a tube will be put down your throat to help you breathe.    An IV will be inserted into one of your veins.  Your bladder may be emptied with a small tube (catheter).  An incision will be made just below your belly button.  Your fallopian tubes will be located and brought up through the incision.  Your fallopian tubes will be tied off, burned (cauterized), or blocked with a clip, ring, or clamp. A small part in the center of each fallopian tube may be removed.  The incision will be closed with stitches (sutures).  A bandage (dressing) will be placed over the incision. If you had a cesarean delivery:  Tubal ligation will be done through the incision that was used for the cesarean delivery of your baby.  The incision will be closed with sutures.  A dressing will be placed over the incision. The procedure may vary among health care providers and  hospitals. What happens after the procedure?  Your blood pressure, heart rate, breathing rate, and blood oxygen level will be monitored until you leave the hospital.  You will be given pain medicine as needed.  Do not drive for 24 hours if you were given a sedative during your procedure. Summary  Postpartum tubal ligation is a procedure that closes the fallopian tubes so you cannot get pregnant anymore.  This procedure is done while you are still in the hospital after childbirth. If you have a cesarean section, it can be performed at the same time.  Having this done after childbirth does not make your stay in the hospital longer.  Postpartum tubal ligation is considered permanent. You should not have this procedure if you want to get pregnant again or if you are unsure about having more children.  Talk to your health care provider to see if this procedure is right for you. This information is not intended to replace advice given to you by your health care provider. Make sure you discuss any questions you have with your health care provider. Document Revised: 12/17/2018 Document Reviewed: 05/24/2018 Elsevier Patient Education  2020 Elsevier Inc.  

## 2020-01-16 NOTE — Progress Notes (Signed)
OBSTETRICS PRENATAL VIRTUAL VISIT ENCOUNTER NOTE  Provider location: Center for [facility] at [facility]   I connected with Jennifer Ross on 01/16/20 at  8:15 AM EDT by MyChart Video Encounter at home and verified that I am speaking with the correct person using two identifiers.   I discussed the limitations, risks, security and privacy concerns of performing an evaluation and management service virtually and the availability of in person appointments. I also discussed with the patient that there may be a patient responsible charge related to this service. The patient expressed understanding and agreed to proceed. Subjective:  Jennifer Ross is a 34 y.o. [phone] at [redacted]w[redacted]d being seen today for ongoing prenatal care.  She is currently monitored for the following issues for this low-risk pregnancy and has Supervision of other normal pregnancy, antepartum; History of low transverse cesarean section; Request for sterilization; Fractures involving multiple body regions; Thalassemia alpha carrier; AMA (advanced maternal age) multigravida 35+; Constipation during pregnancy in second trimester; and Hemorrhoids on their problem list.  Patient reports no complaints.  Contractions: Not present.  .  Movement: Present. Denies any leaking of fluid.   The following portions of the patient's history were reviewed and updated as appropriate: allergies, current medications, past family history, past medical history, past social history, past surgical history and problem list.   Objective:  There were no vitals filed for this visit.  Fetal Status:     Movement: Present     General:  Alert, oriented and cooperative. Patient is in no acute distress.  Respiratory: Normal respiratory effort, no problems with respiration noted  Mental Status: Normal mood and affect. Normal behavior. Normal judgment and thought content.  Rest of physical exam deferred due to type of encounter  Imaging: [place] MFM OB DETAIL  +14 WK  Result Date: 12/23/2019 ----------------------------------------------------------------------  OBSTETRICS REPORT                       (Signed Final 12/23/2019 04:06 pm) ---------------------------------------------------------------------- Patient Info  ID #:       [id number]                          D.O.B.:  07-09-1986 (34 yrs)  Name:       Jennifer Ross                  Visit Date: 12/23/2019 02:08 pm ---------------------------------------------------------------------- Performed By  Attending:        Ma Rings MD         Secondary Phy.:   [facility]  Performed By:     Criselda Peaches         Address:          [address]                    [address]                                                             [address]  Referred By:      Jethro Bastos               Location:         [facility]  Macon Large MD                               Fetal Care  Ref. Address:     [address]                    [place], [place]                    [address] ---------------------------------------------------------------------- Orders  #  Description                           Code        Ordered By  1  [place] MFM OB DETAIL +14 WK               83729.02    Jaynie Collins ----------------------------------------------------------------------  #  Order #                     Accession #                Episode #  1  [id number]                   [id number]                 [id number] ---------------------------------------------------------------------- Indications  Advanced maternal age multigravida 59+,        O77.522  second trimester  Encounter for antenatal screening for          Z36.3  malformations  [redacted] weeks gestation of pregnancy                Z3A.19  Genetic carrier (alpha thalassemia)            Z14.8  Previous cesarean delivery, antepartum         O34.219  Neg AFP, low risk NIPS ---------------------------------------------------------------------- Fetal Evaluation  Num Of  Fetuses:         1  Fetal Heart Rate(bpm):  157  Cardiac Activity:       Observed  Presentation:           Cephalic  Placenta:               Posterior  P. Cord Insertion:      Visualized, central  Amniotic Fluid  AFI FV:      Within normal limits                              Largest Pocket(cm)                              3.3 ---------------------------------------------------------------------- Biometry  BPD:      43.2  mm     G. Age:  19w 0d         47  %    CI:        71.41   %    70 - 86  FL/HC:      20.1   %    16.1 - 18.3  HC:      162.8  mm     G. Age:  19w 0d         38  %    HC/AC:      1.11        1.09 - 1.39  AC:      146.6  mm     G. Age:  20w 0d         72  %    FL/BPD:     75.7   %  FL:       32.7  mm     G. Age:  20w 1d         80  %    FL/AC:      22.3   %    20 - 24  HUM:      30.4  mm     G. Age:  20w 0d         74  %  CER:      19.7  mm     G. Age:  18w 6d         43  %  CM:        3.5  mm  Est. FW:     322  gm    0 lb 11 oz      88  % ---------------------------------------------------------------------- OB History  Gravidity:    4         Term:   2  TOP:          1        Living:  2 ---------------------------------------------------------------------- Gestational Age  U/S Today:     19w 4d                                        EDD:   05/14/20  Best:          19w 1d     Det. By:  Marcella Dubs         EDD:   05/17/20                                      (09/27/19) ---------------------------------------------------------------------- Anatomy  Cranium:               Appears normal         Aortic Arch:            Not well visualized  Cavum:                 Appears normal         Ductal Arch:            Not well visualized  Ventricles:            Appears normal         Diaphragm:              Appears normal  Choroid Plexus:        Appears normal         Stomach:                Appears normal, left  sided  Cerebellum:            Appears normal         Abdomen:                Appears normal  Posterior Fossa:       Appears normal         Abdominal Wall:         Appears nml (cord                                                                        insert, abd wall)  Nuchal Fold:           Appears normal         Cord Vessels:           Appears normal (3                                                                        vessel cord)  Face:                  Orbits nl; profile not Kidneys:                Appear normal                         well visualized  Lips:                  Appears normal         Bladder:                Appears normal  Thoracic:              Appears normal         Spine:                  Appears normal  Heart:                 Appears normal         Upper Extremities:      Appears normal                         (4CH, axis, and                         situs)  RVOT:                  Not well visualized    Lower Extremities:      Appears normal  LVOT:                  Not well visualized  Other:  Open hands and fifth visualized. Technically difficult due to maternal          habitus and fetal position. ---------------------------------------------------------------------- Cervix Uterus Adnexa  Cervix  Length:  5.28  cm. ---------------------------------------------------------------------- Comments  This patient was seen for a detailed fetal anatomy scan due  to advanced maternal age.  She denies any significant past medical history and denies  any problems in her current pregnancy.  She had a cell free DNA test earlier in her pregnancy which  indicated a low risk for trisomy 54, 97, and 13. A female fetus  is predicted.  She was informed that the fetal growth and amniotic fluid  level were appropriate for her gestational age.  There were no obvious fetal anomalies noted on today's  ultrasound exam.  However, the views of the fetal anatomy  were limited  today due to the fetal position.  She was advised that anomalies may be missed due to  technical limitations. If the fetus is in a suboptimal position or  maternal habitus is increased, visualization of the fetus in the  maternal uterus may be impaired.  The increased risk of fetal aneuploidy due to advanced  maternal age was discussed. Due to advanced maternal age,  the patient was offered and declined an amniocentesis today  for definitive diagnosis of fetal aneuploidy.  A follow-up exam was scheduled in 4 weeks to complete the  views of the fetal anatomy. ----------------------------------------------------------------------                   Ma Rings, MD Electronically Signed Final Report   12/23/2019 04:06 pm ----------------------------------------------------------------------   Assessment and Plan:  Pregnancy: [id number] at [redacted]w[redacted]d 1. History of low transverse cesarean section Desires TOLAC, consent to be signed next visit  2. [redacted] weeks gestation of pregnancy 3. Supervision of other normal pregnancy, antepartum Follow up scan ordered 01/21/20, will follow up results and manage accordingly. Preterm labor symptoms and general obstetric precautions including but not limited to vaginal bleeding, contractions, leaking of fluid and fetal movement were reviewed in detail with the patient. I discussed the assessment and treatment plan with the patient. The patient was provided an opportunity to ask questions and all were answered. The patient agreed with the plan and demonstrated an understanding of the instructions. The patient was advised to call back or seek an in-person office evaluation/go to MAU at [facility] for any urgent or concerning symptoms. Please refer to After Visit Summary for other counseling recommendations.   I provided 10 minutes of face-to-face time during this encounter.  Return in about 5 weeks (around 02/20/2020) for 2 hr GTT, 3rd trimester labs, TDap, OFFICE OB  Visit.  Future Appointments  Date Time Provider Department Center  01/21/2020  3:30 PM [facility] US3 [facility]    Jaynie Collins, MD Center for [facility], [facility]

## 2020-01-21 ENCOUNTER — Ambulatory Visit: Payer: Medicaid Other

## 2020-01-23 ENCOUNTER — Other Ambulatory Visit: Payer: Self-pay

## 2020-01-23 ENCOUNTER — Ambulatory Visit: Payer: Medicaid Other

## 2020-01-23 ENCOUNTER — Ambulatory Visit: Payer: Medicaid Other | Attending: Obstetrics

## 2020-01-23 DIAGNOSIS — Z148 Genetic carrier of other disease: Secondary | ICD-10-CM

## 2020-01-23 DIAGNOSIS — O09529 Supervision of elderly multigravida, unspecified trimester: Secondary | ICD-10-CM | POA: Insufficient documentation

## 2020-01-23 DIAGNOSIS — O34219 Maternal care for unspecified type scar from previous cesarean delivery: Secondary | ICD-10-CM

## 2020-01-23 DIAGNOSIS — Z362 Encounter for other antenatal screening follow-up: Secondary | ICD-10-CM | POA: Diagnosis not present

## 2020-01-23 DIAGNOSIS — O09522 Supervision of elderly multigravida, second trimester: Secondary | ICD-10-CM | POA: Diagnosis not present

## 2020-01-23 DIAGNOSIS — Z3A23 23 weeks gestation of pregnancy: Secondary | ICD-10-CM

## 2020-02-10 MED ORDER — HYDROCORTISONE ACETATE 25 MG RE SUPP: 25.0000 mg | Freq: Two times a day (BID) | RECTAL | 1 refills | Status: DC

## 2020-02-10 NOTE — Progress Notes (Signed)
A user error has taken place: encounter opened in error, closed for administrative reasons.            This encounter was created in error - please disregard.

## 2020-02-11 ENCOUNTER — Telehealth: Payer: Self-pay

## 2020-02-11 NOTE — Telephone Encounter (Signed)
Received call from patient she reports her hemorrhoids are getting worse. She is having a hard time walking and would like to know what she can do. Per Dr.Anyanuw we can in some Anusol 25 suppository to see if that will help. She can try tylenol for pain. If this does not help she should follow up with her PCP to look at her hemorrhoids. Patient voice understanding at this time.

## 2020-02-20 ENCOUNTER — Encounter: Payer: Self-pay | Admitting: Obstetrics & Gynecology

## 2020-02-20 ENCOUNTER — Ambulatory Visit (INDEPENDENT_AMBULATORY_CARE_PROVIDER_SITE_OTHER): Payer: Medicaid Other | Admitting: Obstetrics & Gynecology

## 2020-02-20 ENCOUNTER — Other Ambulatory Visit: Payer: Self-pay

## 2020-02-20 VITALS — BP 119/74 | HR 92 | Wt 203.0 lb

## 2020-02-20 DIAGNOSIS — Z23 Encounter for immunization: Secondary | ICD-10-CM

## 2020-02-20 DIAGNOSIS — O99013 Anemia complicating pregnancy, third trimester: Secondary | ICD-10-CM

## 2020-02-20 DIAGNOSIS — Z3480 Encounter for supervision of other normal pregnancy, unspecified trimester: Secondary | ICD-10-CM

## 2020-02-20 DIAGNOSIS — D509 Iron deficiency anemia, unspecified: Secondary | ICD-10-CM | POA: Insufficient documentation

## 2020-02-20 DIAGNOSIS — Z98891 History of uterine scar from previous surgery: Secondary | ICD-10-CM

## 2020-02-20 DIAGNOSIS — Z302 Encounter for sterilization: Secondary | ICD-10-CM

## 2020-02-20 DIAGNOSIS — Z3A27 27 weeks gestation of pregnancy: Secondary | ICD-10-CM

## 2020-02-20 LAB — CBC
Hematocrit: 32.2 % — ABNORMAL LOW (ref 34.0–46.6)
Hemoglobin: 10 g/dL — ABNORMAL LOW (ref 11.1–15.9)
MCH: 23.6 pg — ABNORMAL LOW (ref 26.6–33.0)
MCHC: 31.1 g/dL — ABNORMAL LOW (ref 31.5–35.7)
MCV: 76 fL — ABNORMAL LOW (ref 79–97)
Platelets: 269 10*3/uL (ref 150–450)
RBC: 4.24 x10E6/uL (ref 3.77–5.28)
RDW: 14.7 % (ref 11.7–15.4)
WBC: 6.3 10*3/uL (ref 3.4–10.8)

## 2020-02-20 MED ORDER — FERROUS SULFATE 325 (65 FE) MG PO TABS: 325.0000 mg | ORAL_TABLET | Freq: Every day | ORAL | 3 refills | Status: AC
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
## 2020-02-20 NOTE — Patient Instructions (Signed)
Return to office for any scheduled appointments. Call the office or go to the MAU at [facility] if:  You begin to have strong, frequent contractions  Your water breaks.  Sometimes it is a big gush of fluid, sometimes it is just a trickle that keeps getting your panties wet or running down your legs  You have vaginal bleeding.  It is normal to have a small amount of spotting if your cervix was checked.   You do not feel your baby moving like normal.  If you do not, get something to eat and drink and lay down and focus on feeling your baby move.   If your baby is still not moving like normal, you should call the office or go to MAU.  Any other obstetric concerns.   

## 2020-02-20 NOTE — Addendum Note (Signed)
Addended by: Jaynie Collins A on: 02/20/2020 10:39 PM   Modules accepted: Orders

## 2020-02-21 LAB — GLUCOSE TOLERANCE, 2 HOURS W/ 1HR
Glucose, 1 hour: 154 mg/dL (ref 65–179)
Glucose, 2 hour: 97 mg/dL (ref 65–152)
Glucose, Fasting: 79 mg/dL (ref 65–91)

## 2020-02-21 LAB — HIV ANTIBODY (ROUTINE TESTING W REFLEX): HIV Screen 4th Generation wRfx: NONREACTIVE

## 2020-02-21 LAB — RPR: RPR Ser Ql: NONREACTIVE

## 2020-03-02 ENCOUNTER — Other Ambulatory Visit: Payer: Self-pay

## 2020-03-02 ENCOUNTER — Inpatient Hospital Stay (HOSPITAL_COMMUNITY)
Admission: AD | Admit: 2020-03-02 | Discharge: 2020-03-02 | Disposition: A | Discharge status: Home / Self Care | Payer: Medicaid Other | Attending: Obstetrics and Gynecology | Admitting: Obstetrics and Gynecology

## 2020-03-02 ENCOUNTER — Encounter (HOSPITAL_COMMUNITY): Payer: Self-pay | Admitting: Obstetrics and Gynecology

## 2020-03-02 DIAGNOSIS — Z3A29 29 weeks gestation of pregnancy: Secondary | ICD-10-CM | POA: Diagnosis not present

## 2020-03-02 DIAGNOSIS — N898 Other specified noninflammatory disorders of vagina: Secondary | ICD-10-CM | POA: Insufficient documentation

## 2020-03-02 DIAGNOSIS — O26893 Other specified pregnancy related conditions, third trimester: Secondary | ICD-10-CM | POA: Insufficient documentation

## 2020-03-02 DIAGNOSIS — Z3689 Encounter for other specified antenatal screening: Secondary | ICD-10-CM

## 2020-03-02 DIAGNOSIS — Z79899 Other long term (current) drug therapy: Secondary | ICD-10-CM | POA: Insufficient documentation

## 2020-03-02 DIAGNOSIS — Z0371 Encounter for suspected problem with amniotic cavity and membrane ruled out: Secondary | ICD-10-CM | POA: Diagnosis not present

## 2020-03-02 LAB — URINALYSIS, ROUTINE W REFLEX MICROSCOPIC
Bilirubin Urine: NEGATIVE
Glucose, UA: NEGATIVE mg/dL
Hgb urine dipstick: NEGATIVE
Ketones, ur: NEGATIVE mg/dL
Leukocytes,Ua: NEGATIVE
Nitrite: NEGATIVE
Protein, ur: NEGATIVE mg/dL
Specific Gravity, Urine: 1.018 (ref 1.005–1.030)
pH: 7 (ref 5.0–8.0)

## 2020-03-02 LAB — WET PREP, GENITAL
Clue Cells Wet Prep HPF POC: NONE SEEN
Sperm: NONE SEEN
Trich, Wet Prep: NONE SEEN
Yeast Wet Prep HPF POC: NONE SEEN

## 2020-03-02 LAB — AMNISURE RUPTURE OF MEMBRANE (ROM) NOT AT ARMC: Amnisure ROM: NEGATIVE
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
## 2020-03-02 NOTE — MAU Note (Signed)
.   Jennifer Ross is a 34 y.o. at [redacted]w[redacted]d here in MAU reporting: leakage of bright yellow vaginal fluid for a couple of weeks but noticed more fluid than normal this morning. Called her physician and was told to come in and be evaluated. Denies any pain or VB   Onset of complaint: 2 weeks Pain score: 0 Vitals:   03/02/20 1433  BP: 119/60  Pulse: 100  Resp: 16  Temp: 98.2 F (36.8 C)  SpO2: 100%     FHT:142 Lab orders placed from triage: UA

## 2020-03-02 NOTE — Discharge Instructions (Signed)
Preterm Labor and Birth Information ° °The normal length of a pregnancy is 39-41 weeks. Preterm labor is when labor starts before 37 completed weeks of pregnancy. °What are the risk factors for preterm labor? °Preterm labor is more likely to occur in women who: °· Have certain infections during pregnancy such as a bladder infection, sexually transmitted infection, or infection inside the uterus (chorioamnionitis). °· Have a shorter-than-normal cervix. °· Have gone into preterm labor before. °· Have had surgery on their cervix. °· Are younger than age 17 or older than age 35. °· Are African American. °· Are pregnant with twins or multiple babies (multiple gestation). °· Take street drugs or smoke while pregnant. °· Do not gain enough weight while pregnant. °· Became pregnant shortly after having been pregnant. °What are the symptoms of preterm labor? °Symptoms of preterm labor include: °· Cramps similar to those that can happen during a menstrual period. The cramps may happen with diarrhea. °· Pain in the abdomen or lower back. °· Regular uterine contractions that may feel like tightening of the abdomen. °· A feeling of increased pressure in the pelvis. °· Increased watery or bloody mucus discharge from the vagina. °· Water breaking (ruptured amniotic sac). °Why is it important to recognize signs of preterm labor? °It is important to recognize signs of preterm labor because babies who are born prematurely may not be fully developed. This can put them at an increased risk for: °· Long-term (chronic) heart and lung problems. °· Difficulty immediately after birth with regulating body systems, including blood sugar, body temperature, heart rate, and breathing rate. °· Bleeding in the brain. °· Cerebral palsy. °· Learning difficulties. °· Death. °These risks are highest for babies who are born before 34 weeks of pregnancy. °How is preterm labor treated? °Treatment depends on the length of your pregnancy, your condition,  and the health of your baby. It may involve: °· Having a stitch (suture) placed in your cervix to prevent your cervix from opening too early (cerclage). °· Taking or being given medicines, such as: °? Hormone medicines. These may be given early in pregnancy to help support the pregnancy. °? Medicine to stop contractions. °? Medicines to help mature the baby’s lungs. These may be prescribed if the risk of delivery is high. °? Medicines to prevent your baby from developing cerebral palsy. °If the labor happens before 34 weeks of pregnancy, you may need to stay in the hospital. °What should I do if I think I am in preterm labor? °If you think that you are going into preterm labor, call your health care provider right away. °How can I prevent preterm labor in future pregnancies? °To increase your chance of having a full-term pregnancy: °· Do not use any tobacco products, such as cigarettes, chewing tobacco, and e-cigarettes. If you need help quitting, ask your health care provider. °· Do not use street drugs or medicines that have not been prescribed to you during your pregnancy. °· Talk with your health care provider before taking any herbal supplements, even if you have been taking them regularly. °· Make sure you gain a healthy amount of weight during your pregnancy. °· Watch for infection. If you think that you might have an infection, get it checked right away. °· Make sure to tell your health care provider if you have gone into preterm labor before. °This information is not intended to replace advice given to you by your health care provider. Make sure you discuss any questions you have with your   health care provider. Document Revised: 10/26/2018 Document Reviewed: 11/25/2015 Elsevier Patient Education  2020 [employer]        Premature Rupture and Preterm Premature Rupture of Membranes  Rupture of membranes is when the membranes (amniotic sac) that hold your baby break open. This is commonly  referred to as your "water breaking." If your water breaks before labor starts (prematurely), it is called premature rupture of membranes (PROM). If PROM occurs before 37 weeks of pregnancy, it is called preterm premature rupture of membranes (PPROM). Because the amniotic sac keeps infection out and performs other important functions, having the amniotic sac rupture before 37 weeks of pregnancy can lead to serious problems. It requires immediate attention from a health care provider. What are the causes? When PROM occurs at 37 weeks of pregnancy or later, it is usually caused by natural weakening of the membranes and friction caused by contractions. PPROM is usually caused by infection. In many cases, the cause is not known. What increases the risk of PPROM? The following factors may make you more likely to have PPROM:  Infection.  Having had PPROM in a previous pregnancy.  Short cervical length.  Bleeding during the second or third trimester.  Low BMI, which is an estimate of body fat.  Smoking.  Using drugs.  Low socioeconomic status. What problems can be caused by PROM and PPROM? This condition creates health dangers for the mother and the baby. These include:  Delivering a premature baby.  Getting a serious infection of the placental tissues (chorioamnionitis).  Early detachment of the placenta from the uterus (placental abruption).  Compression of the umbilical cord.  Developing a serious infection after delivery. What are the signs of PROM and PPROM? Signs of this condition include:  A sudden gush or slow leaking of fluid from the vagina.  Constant wet underwear. Sometimes, women mistake the leaking or wetness for urine, especially if the leak is slow and not a gush of fluid. If there is constant leaking or if your underwear continues to get wet, your membranes have likely ruptured. What should I do if I think my membranes have ruptured?  Call your health care  provider right away.  You will need to go to the hospital immediately to be checked by a health care provider. What happens if I am diagnosed with PROM or PPROM? Once you arrive at the hospital, you will have tests done. A cervical exam will be done using a lubricated instrument (speculum) to check whether the cervix has softened or started to open (dilate).  If you are diagnosed with PROM, your labor may be started for you (you may be induced) within 24 hours if you are not having contractions.  If you are diagnosed with PPROM and you are not having contractions, you may be induced depending on your trimester. If you have PPROM:  You and your baby will be monitored closely for signs of infection or other complications.  You may be given: ? An antibiotic medicine to lower the chances of developing an infection. ? A steroid medicine to help mature the baby's lungs more quickly. ? A medicine to help prevent cerebral palsy in your baby. ? A medicine to stop preterm labor.  You may be ordered to be on bed rest at home or in the hospital.  You may be induced if complications occur for you or the baby. Your treatment will depend on many factors, such as how many weeks you have been pregnant (how far  along you are), the development of the baby, and other complications that may occur. This information is not intended to replace advice given to you by your health care provider. Make sure you discuss any questions you have with your health care provider. Document Revised: 10/26/2018 Document Reviewed: 02/08/2016 Elsevier Patient Education  2020 [employer]        [employer] of the uterus can occur throughout pregnancy, but they are not always a sign that you are in labor. You may have practice contractions called Braxton Hicks contractions. These false labor contractions are sometimes confused with true labor. What are Deberah Pelton contractions? Braxton  Hicks contractions are tightening movements that occur in the muscles of the uterus before labor. Unlike true labor contractions, these contractions do not result in opening (dilation) and thinning of the cervix. Toward the end of pregnancy (32-34 weeks), Braxton Hicks contractions can happen more often and may become stronger. These contractions are sometimes difficult to tell apart from true labor because they can be very uncomfortable. You should not feel embarrassed if you go to the hospital with false labor. Sometimes, the only way to tell if you are in true labor is for your health care provider to look for changes in the cervix. The health care provider will do a physical exam and may monitor your contractions. If you are not in true labor, the exam should show that your cervix is not dilating and your water has not broken. If there are no other health problems associated with your pregnancy, it is completely safe for you to be sent home with false labor. You may continue to have Braxton Hicks contractions until you go into true labor. How to tell the difference between true labor and false labor True labor  Contractions last 30-70 seconds.  Contractions become very regular.  Discomfort is usually felt in the top of the uterus, and it spreads to the lower abdomen and low back.  Contractions do not go away with walking.  Contractions usually become more intense and increase in frequency.  The cervix dilates and gets thinner. False labor  Contractions are usually shorter and not as strong as true labor contractions.  Contractions are usually irregular.  Contractions are often felt in the front of the lower abdomen and in the groin.  Contractions may go away when you walk around or change positions while lying down.  Contractions get weaker and are shorter-lasting as time goes on.  The cervix usually does not dilate or become thin. Follow these instructions at home:   Take  over-the-counter and prescription medicines only as told by your health care provider.  Keep up with your usual exercises and follow other instructions from your health care provider.  Eat and drink lightly if you think you are going into labor.  If Braxton Hicks contractions are making you uncomfortable: ? Change your position from lying down or resting to walking, or change from walking to resting. ? Sit and rest in a tub of warm water. ? Drink enough fluid to keep your urine pale yellow. Dehydration may cause these contractions. ? Do slow and deep breathing several times an hour.  Keep all follow-up prenatal visits as told by your health care provider. This is important. Contact a health care provider if:  You have a fever.  You have continuous pain in your abdomen. Get help right away if:  Your contractions become stronger, more regular, and closer together.  You have fluid leaking  or gushing from your vagina.  You pass blood-tinged mucus (bloody show).  You have bleeding from your vagina.  You have low back pain that you never had before.  You feel your babys head pushing down and causing pelvic pressure.  Your baby is not moving inside you as much as it used to. Summary  Contractions that occur before labor are called Braxton Hicks contractions, false labor, or practice contractions.  Braxton Hicks contractions are usually shorter, weaker, farther apart, and less regular than true labor contractions. True labor contractions usually become progressively stronger and regular, and they become more frequent.  Manage discomfort from [facility] contractions by changing position, resting in a warm bath, drinking plenty of water, or practicing deep breathing. This information is not intended to replace advice given to you by your health care provider. Make sure you discuss any questions you have with your health care provider. Document Revised: 06/16/2017 Document Reviewed:  11/17/2016 Elsevier Patient Education  2020 [employer].

## 2020-03-03 LAB — GC/CHLAMYDIA PROBE AMP (~~LOC~~) NOT AT ARMC
Chlamydia: NEGATIVE
Comment: NEGATIVE
Comment: NORMAL
Neisseria Gonorrhea: NEGATIVE

## 2020-03-12 ENCOUNTER — Ambulatory Visit (INDEPENDENT_AMBULATORY_CARE_PROVIDER_SITE_OTHER): Payer: Medicaid Other | Admitting: Family Medicine

## 2020-03-12 ENCOUNTER — Other Ambulatory Visit: Payer: Self-pay

## 2020-03-12 ENCOUNTER — Encounter: Payer: Self-pay | Admitting: Family Medicine

## 2020-03-12 VITALS — BP 105/71 | HR 98 | Wt 202.0 lb

## 2020-03-12 DIAGNOSIS — O09523 Supervision of elderly multigravida, third trimester: Secondary | ICD-10-CM

## 2020-03-12 DIAGNOSIS — K649 Unspecified hemorrhoids: Secondary | ICD-10-CM

## 2020-03-12 DIAGNOSIS — Z98891 History of uterine scar from previous surgery: Secondary | ICD-10-CM

## 2020-03-12 DIAGNOSIS — Z3480 Encounter for supervision of other normal pregnancy, unspecified trimester: Secondary | ICD-10-CM

## 2020-03-12 MED ORDER — MISC. DEVICES MISC: 0 refills | Status: DC

## 2020-03-12 NOTE — Patient Instructions (Addendum)
[facility]   [address] [place], [place], [place] [address] Phone: [phone]  Monday     8:30AM-5PM Tuesday 8:30AM-5PM Wednesday 8:30AM-5PM Thursday 8:30AM-5PM Friday  8:30AM-5PM Saturday Closed Sunday Closed    Breastfeeding  Choosing to breastfeed is one of the best decisions you can make for yourself and your baby. A change in hormones during pregnancy causes your breasts to make breast milk in your milk-producing glands. Hormones prevent breast milk from being released before your baby is born. They also prompt milk flow after birth. Once breastfeeding has begun, thoughts of your baby, as well as his or her sucking or crying, can stimulate the release of milk from your milk-producing glands. Benefits of breastfeeding Research shows that breastfeeding offers many health benefits for infants and mothers. It also offers a cost-free and convenient way to feed your baby. For your baby  Your first milk (colostrum) helps your baby's digestive system to function better.  Special cells in your milk (antibodies) help your baby to fight off infections.  Breastfed babies are less likely to develop asthma, allergies, obesity, or type 2 diabetes. They are also at lower risk for sudden infant death syndrome (SIDS).  Nutrients in breast milk are better able to meet your baby's needs compared to infant formula.  Breast milk improves your baby's brain development. For you  Breastfeeding helps to create a very special bond between you and your baby.  Breastfeeding is convenient. Breast milk costs nothing and is always available at the correct temperature.  Breastfeeding helps to burn calories. It helps you to lose the weight that you gained during pregnancy.  Breastfeeding makes your uterus return faster to its size before pregnancy. It also slows bleeding (lochia) after you give birth.  Breastfeeding helps to lower your risk of developing type 2 diabetes, osteoporosis,  rheumatoid arthritis, cardiovascular disease, and breast, ovarian, uterine, and endometrial cancer later in life. Breastfeeding basics Starting breastfeeding  Find a comfortable place to sit or lie down, with your neck and back well-supported.  Place a pillow or a rolled-up blanket under your baby to bring him or her to the level of your breast (if you are seated). Nursing pillows are specially designed to help support your arms and your baby while you breastfeed.  Make sure that your baby's tummy (abdomen) is facing your abdomen.  Gently massage your breast. With your fingertips, massage from the outer edges of your breast inward toward the nipple. This encourages milk flow. If your milk flows slowly, you may need to continue this action during the feeding.  Support your breast with 4 fingers underneath and your thumb above your nipple (make the letter "C" with your hand). Make sure your fingers are well away from your nipple and your baby's mouth.  Stroke your baby's lips gently with your finger or nipple.  When your baby's mouth is open wide enough, quickly bring your baby to your breast, placing your entire nipple and as much of the areola as possible into your baby's mouth. The areola is the colored area around your nipple. ? More areola should be visible above your baby's upper lip than below the lower lip. ? Your baby's lips should be opened and extended outward (flanged) to ensure an adequate, comfortable latch. ? Your baby's tongue should be between his or her lower gum and your breast.  Make sure that your baby's mouth is correctly positioned around your nipple (latched). Your baby's lips should create a seal on your  breast and be turned out (everted).  It is common for your baby to suck about 2-3 minutes in order to start the flow of breast milk. Latching Teaching your baby how to latch onto your breast properly is very important. An improper latch can cause nipple pain, decreased  milk supply, and poor weight gain in your baby. Also, if your baby is not latched onto your nipple properly, he or she may swallow some air during feeding. This can make your baby fussy. Burping your baby when you switch breasts during the feeding can help to get rid of the air. However, teaching your baby to latch on properly is still the best way to prevent fussiness from swallowing air while breastfeeding. Signs that your baby has successfully latched onto your nipple  Silent tugging or silent sucking, without causing you pain. Infant's lips should be extended outward (flanged).  Swallowing heard between every 3-4 sucks once your milk has started to flow (after your let-down milk reflex occurs).  Muscle movement above and in front of his or her ears while sucking. Signs that your baby has not successfully latched onto your nipple  Sucking sounds or smacking sounds from your baby while breastfeeding.  Nipple pain. If you think your baby has not latched on correctly, slip your finger into the corner of your baby's mouth to break the suction and place it between your baby's gums. Attempt to start breastfeeding again. Signs of successful breastfeeding Signs from your baby  Your baby will gradually decrease the number of sucks or will completely stop sucking.  Your baby will fall asleep.  Your baby's body will relax.  Your baby will retain a small amount of milk in his or her mouth.  Your baby will let go of your breast by himself or herself. Signs from you  Breasts that have increased in firmness, weight, and size 1-3 hours after feeding.  Breasts that are softer immediately after breastfeeding.  Increased milk volume, as well as a change in milk consistency and color by the fifth day of breastfeeding.  Nipples that are not sore, cracked, or bleeding. Signs that your baby is getting enough milk  Wetting at least 1-2 diapers during the first 24 hours after birth.  Wetting at  least 5-6 diapers every 24 hours for the first week after birth. The urine should be clear or pale yellow by the age of 5 days.  Wetting 6-8 diapers every 24 hours as your baby continues to grow and develop.  At least 3 stools in a 24-hour period by the age of 5 days. The stool should be soft and yellow.  At least 3 stools in a 24-hour period by the age of 7 days. The stool should be seedy and yellow.  No loss of weight greater than 10% of birth weight during the first 3 days of life.  Average weight gain of 4-7 oz (113-198 g) per week after the age of 4 days.  Consistent daily weight gain by the age of 5 days, without weight loss after the age of 2 weeks. After a feeding, your baby may spit up a small amount of milk. This is normal. Breastfeeding frequency and duration Frequent feeding will help you make more milk and can prevent sore nipples and extremely full breasts (breast engorgement). Breastfeed when you feel the need to reduce the fullness of your breasts or when your baby shows signs of hunger. This is called "breastfeeding on demand." Signs that your baby is hungry  include:  Increased alertness, activity, or restlessness.  Movement of the head from side to side.  Opening of the mouth when the corner of the mouth or cheek is stroked (rooting).  Increased sucking sounds, smacking lips, cooing, sighing, or squeaking.  Hand-to-mouth movements and sucking on fingers or hands.  Fussing or crying. Avoid introducing a pacifier to your baby in the first 4-6 weeks after your baby is born. After this time, you may choose to use a pacifier. Research has shown that pacifier use during the first year of a baby's life decreases the risk of sudden infant death syndrome (SIDS). Allow your baby to feed on each breast as long as he or she wants. When your baby unlatches or falls asleep while feeding from the first breast, offer the second breast. Because newborns are often sleepy in the first few  weeks of life, you may need to awaken your baby to get him or her to feed. Breastfeeding times will vary from baby to baby. However, the following rules can serve as a guide to help you make sure that your baby is properly fed:  Newborns (babies 16 weeks of age or younger) may breastfeed every 1-3 hours.  Newborns should not go without breastfeeding for longer than 3 hours during the day or 5 hours during the night.  You should breastfeed your baby a minimum of 8 times in a 24-hour period. Breast milk pumping     Pumping and storing breast milk allows you to make sure that your baby is exclusively fed your breast milk, even at times when you are unable to breastfeed. This is especially important if you go back to work while you are still breastfeeding, or if you are not able to be present during feedings. Your lactation consultant can help you find a method of pumping that works best for you and give you guidelines about how long it is safe to store breast milk. Caring for your breasts while you breastfeed Nipples can become dry, cracked, and sore while breastfeeding. The following recommendations can help keep your breasts moisturized and healthy:  Avoid using soap on your nipples.  Wear a supportive bra designed especially for nursing. Avoid wearing underwire-style bras or extremely tight bras (sports bras).  Air-dry your nipples for 3-4 minutes after each feeding.  Use only cotton bra pads to absorb leaked breast milk. Leaking of breast milk between feedings is normal.  Use lanolin on your nipples after breastfeeding. Lanolin helps to maintain your skin's normal moisture barrier. Pure lanolin is not harmful (not toxic) to your baby. You may also hand express a few drops of breast milk and gently massage that milk into your nipples and allow the milk to air-dry. In the first few weeks after giving birth, some women experience breast engorgement. Engorgement can make your breasts feel heavy,  warm, and tender to the touch. Engorgement peaks within 3-5 days after you give birth. The following recommendations can help to ease engorgement:  Completely empty your breasts while breastfeeding or pumping. You may want to start by applying warm, moist heat (in the shower or with warm, water-soaked hand towels) just before feeding or pumping. This increases circulation and helps the milk flow. If your baby does not completely empty your breasts while breastfeeding, pump any extra milk after he or she is finished.  Apply ice packs to your breasts immediately after breastfeeding or pumping, unless this is too uncomfortable for you. To do this: ? Put ice in a  plastic bag. ? Place a towel between your skin and the bag. ? Leave the ice on for 20 minutes, 2-3 times a day.  Make sure that your baby is latched on and positioned properly while breastfeeding. If engorgement persists after 48 hours of following these recommendations, contact your health care provider or a Advertising copywriter. Overall health care recommendations while breastfeeding  Eat 3 healthy meals and 3 snacks every day. Well-nourished mothers who are breastfeeding need an additional 450-500 calories a day. You can meet this requirement by increasing the amount of a balanced diet that you eat.  Drink enough water to keep your urine pale yellow or clear.  Rest often, relax, and continue to take your prenatal vitamins to prevent fatigue, stress, and low vitamin and mineral levels in your body (nutrient deficiencies).  Do not use any products that contain nicotine or tobacco, such as cigarettes and e-cigarettes. Your baby may be harmed by chemicals from cigarettes that pass into breast milk and exposure to secondhand smoke. If you need help quitting, ask your health care provider.  Avoid alcohol.  Do not use illegal drugs or marijuana.  Talk with your health care provider before taking any medicines. These include over-the-counter  and prescription medicines as well as vitamins and herbal supplements. Some medicines that may be harmful to your baby can pass through breast milk.  It is possible to become pregnant while breastfeeding. If birth control is desired, ask your health care provider about options that will be safe while breastfeeding your baby. Where to find more information: [employer]: www.llli.org Contact a health care provider if:  You feel like you want to stop breastfeeding or have become frustrated with breastfeeding.  Your nipples are cracked or bleeding.  Your breasts are red, tender, or warm.  You have: ? Painful breasts or nipples. ? A swollen area on either breast. ? A fever or chills. ? Nausea or vomiting. ? Drainage other than breast milk from your nipples.  Your breasts do not become full before feedings by the fifth day after you give birth.  You feel sad and depressed.  Your baby is: ? Too sleepy to eat well. ? Having trouble sleeping. ? More than 47 week old and wetting fewer than 6 diapers in a 24-hour period. ? Not gaining weight by 63 days of age.  Your baby has fewer than 3 stools in a 24-hour period.  Your baby's skin or the white parts of his or her eyes become yellow. Get help right away if:  Your baby is overly tired (lethargic) and does not want to wake up and feed.  Your baby develops an unexplained fever. Summary  Breastfeeding offers many health benefits for infant and mothers.  Try to breastfeed your infant when he or she shows early signs of hunger.  Gently tickle or stroke your baby's lips with your finger or nipple to allow the baby to open his or her mouth. Bring the baby to your breast. Make sure that much of the areola is in your baby's mouth. Offer one side and burp the baby before you offer the other side.  Talk with your health care provider or lactation consultant if you have questions or you face problems as you breastfeed. This  information is not intended to replace advice given to you by your health care provider. Make sure you discuss any questions you have with your health care provider. Document Revised: 09/28/2017 Document Reviewed: 08/05/2016 Elsevier Patient Education  2020 [employer] Inc[employer]  

## 2020-03-12 NOTE — Progress Notes (Signed)
Discuss issues with hemorrhoids  and would like RX for maternity belt

## 2020-03-12 NOTE — Progress Notes (Signed)
   PRENATAL VISIT NOTE  Subjective:  Jennifer Ross is a 34 y.o. [phone] at [redacted]w[redacted]d being seen today for ongoing prenatal care.  She is currently monitored for the following issues for this low-risk pregnancy and has Supervision of other normal pregnancy, antepartum; History of low transverse cesarean section; Request for sterilization; Fractures involving multiple body regions; Thalassemia alpha carrier; AMA (advanced maternal age) multigravida 35+; Constipation during pregnancy in second trimester; Hemorrhoids; Maternal iron deficiency anemia complicating pregnancy in third trimester; Carotid artery dissection (HCC); and Atlantoaxial dislocation on their problem list.  Patient reports hemorrhoids with bleeding, has anusol HC.  Contractions: Not present. Vag. Bleeding: None.  Movement: Present. Denies leaking of fluid.   The following portions of the patient's history were reviewed and updated as appropriate: allergies, current medications, past family history, past medical history, past social history, past surgical history and problem list.   Objective:   Vitals:   03/12/20 1037  BP: 105/71  Pulse: 98  Weight: 202 lb (91.6 kg)    Fetal Status: Fetal Heart Rate (bpm): 152 Fundal Height: 30 cm Movement: Present     General:  Alert, oriented and cooperative. Patient is in no acute distress.  Skin: Skin is warm and dry. No rash noted.   Cardiovascular: Normal heart rate noted  Respiratory: Normal respiratory effort, no problems with respiration noted  Abdomen: Soft, gravid, appropriate for gestational age.  Pain/Pressure: Absent     Pelvic: Cervical exam deferred        Extremities: Normal range of motion.  Edema: Trace  Mental Status: Normal mood and affect. Normal behavior. Normal judgment and thought content.   Assessment and Plan:  Pregnancy: [id number] at [redacted]w[redacted]d 1. Hemorrhoids, unspecified hemorrhoid type Unlikely to get any sort of treatment during pregnancy  2. Supervision of other  normal pregnancy, antepartum Continue routine prenatal care. Normal 28 wk labs  3. History of low transverse cesarean section No longer wants a BTL--to consider TOLAC  4. Multigravida of advanced maternal age in third trimester Low risk NIPT   5. Covid vaccine counseling The patient was counseled on the potential benefits and lack of known risks of COVID vaccination, during pregnancy and breastfeeding, on today's visit. The patient's questions and concerns were addressed today, including side effects, dangers of Covid. The patient is still unsure of her decision for vaccination. The patient is aware that if she chooses not to get an employee mandated vaccination we will provide documentation for her employers in the form of a letter unless a specific exemption form is submitted to the provider. The patient is aware that this documentation is a deferment of a mandated vaccination that will need to be renewed by a provider following delivery.      Preterm labor symptoms and general obstetric precautions including but not limited to vaginal bleeding, contractions, leaking of fluid and fetal movement were reviewed in detail with the patient. Please refer to After Visit Summary for other counseling recommendations.   Return in 2 weeks (on 03/26/2020) for in person.  Future Appointments  Date Time Provider Department Center  03/26/2020  2:15 PM Reva Bores, MD [facility]    Reva Bores, MD

## 2020-03-18 ENCOUNTER — Other Ambulatory Visit: Payer: Self-pay | Admitting: *Deleted

## 2020-03-18 MED ORDER — MISC. DEVICES MISC: 0 refills | Status: DC

## 2020-03-26 ENCOUNTER — Ambulatory Visit (INDEPENDENT_AMBULATORY_CARE_PROVIDER_SITE_OTHER): Payer: Medicaid Other | Admitting: Family Medicine

## 2020-03-26 ENCOUNTER — Other Ambulatory Visit: Payer: Self-pay

## 2020-03-26 VITALS — BP 124/78 | HR 105 | Wt 202.0 lb

## 2020-03-26 DIAGNOSIS — Z3A32 32 weeks gestation of pregnancy: Secondary | ICD-10-CM

## 2020-03-26 DIAGNOSIS — O09523 Supervision of elderly multigravida, third trimester: Secondary | ICD-10-CM

## 2020-03-26 DIAGNOSIS — Z3480 Encounter for supervision of other normal pregnancy, unspecified trimester: Secondary | ICD-10-CM

## 2020-03-26 DIAGNOSIS — I7771 Dissection of carotid artery: Secondary | ICD-10-CM

## 2020-03-26 DIAGNOSIS — Z23 Encounter for immunization: Secondary | ICD-10-CM | POA: Diagnosis not present

## 2020-03-26 DIAGNOSIS — Z98891 History of uterine scar from previous surgery: Secondary | ICD-10-CM

## 2020-03-26 NOTE — Patient Instructions (Signed)

## 2020-03-26 NOTE — Progress Notes (Signed)
   PRENATAL VISIT NOTE  Subjective:  Jennifer Ross is a 34 y.o. [phone] at 102w4d being seen today for ongoing prenatal care.  She is currently monitored for the following issues for this low-risk pregnancy and has Supervision of other normal pregnancy, antepartum; History of low transverse cesarean section; Request for sterilization; Fractures involving multiple body regions; Thalassemia alpha carrier; AMA (advanced maternal age) multigravida 35+; Constipation during pregnancy in second trimester; Hemorrhoids; Maternal iron deficiency anemia complicating pregnancy in third trimester; Carotid artery dissection (HCC); and Atlantoaxial dislocation on their problem list.  Patient reports no complaints.  Contractions: Not present. Vag. Bleeding: None.  Movement: (!) Decreased. Denies leaking of fluid.   The following portions of the patient's history were reviewed and updated as appropriate: allergies, current medications, past family history, past medical history, past social history, past surgical history and problem list.   Objective:   Vitals:   03/26/20 1434  BP: 124/78  Pulse: (!) 105  Weight: 202 lb (91.6 kg)    Fetal Status: Fetal Heart Rate (bpm): 152 Fundal Height: 32 cm Movement: (!) Decreased    Movement noted during FHR  General:  Alert, oriented and cooperative. Patient is in no acute distress.  Skin: Skin is warm and dry. No rash noted.   Cardiovascular: Normal heart rate noted  Respiratory: Normal respiratory effort, no problems with respiration noted  Abdomen: Soft, gravid, appropriate for gestational age.  Pain/Pressure: Absent     Pelvic: Cervical exam deferred        Extremities: Normal range of motion.  Edema: Trace  Mental Status: Normal mood and affect. Normal behavior. Normal judgment and thought content.   Assessment and Plan:  Pregnancy: [id number] at [redacted]w[redacted]d 1. Supervision of other normal pregnancy, antepartum Continue routine prenatal care.   2. Multigravida of  advanced maternal age in third trimester nml NIPT  3. Carotid artery dissection (HCC)   4. History of low transverse cesarean section For TOLAC now  5. Need for influenza vaccination - Flu Vaccine QUAD 36+ mos IM (Fluarix, Quad PF)  6. Pregnancy with 32 completed weeks gestation   Preterm labor symptoms and general obstetric precautions including but not limited to vaginal bleeding, contractions, leaking of fluid and fetal movement were reviewed in detail with the patient. Please refer to After Visit Summary for other counseling recommendations.   Return in 2 weeks (on 04/09/2020).  Future Appointments  Date Time Provider Department Center  04/09/2020  4:00 PM Reva Bores, MD [facility]  04/23/2020 10:00 AM [place] Bing, MD [facility]  04/30/2020 10:00 AM [place] Large, Jethro Bastos, MD [facility]  05/07/2020 10:00 AM Reva Bores, MD [facility]  05/14/2020 10:00 AM Reva Bores, MD [facility]    Reva Bores, MD

## 2020-04-06 ENCOUNTER — Telehealth: Payer: Self-pay | Admitting: Licensed Clinical Social Worker

## 2020-04-06 NOTE — Telephone Encounter (Signed)
-----   Message from Jennifer Ross sent at 04/02/2020 12:25 PM EDT ----- Regarding: referral Evening Jennifer Ross. Hope all is well.  I would like to refer this member for mental health services.  She reported possible concerns with depression. Member will deliver in October so she may have heighten concerns for pp depression as well. Thanks,

## 2020-04-08 ENCOUNTER — Encounter: Payer: Self-pay | Admitting: Licensed Clinical Social Worker

## 2020-04-08 ENCOUNTER — Ambulatory Visit: Payer: Medicaid Other | Admitting: Licensed Clinical Social Worker

## 2020-04-08 DIAGNOSIS — F331 Major depressive disorder, recurrent, moderate: Secondary | ICD-10-CM
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
## 2020-04-09 ENCOUNTER — Ambulatory Visit (INDEPENDENT_AMBULATORY_CARE_PROVIDER_SITE_OTHER): Payer: Medicaid Other | Admitting: Family Medicine

## 2020-04-09 ENCOUNTER — Other Ambulatory Visit: Payer: Self-pay

## 2020-04-09 VITALS — BP 112/75 | HR 106 | Wt 205.0 lb

## 2020-04-09 DIAGNOSIS — Z3480 Encounter for supervision of other normal pregnancy, unspecified trimester: Secondary | ICD-10-CM

## 2020-04-09 DIAGNOSIS — Z98891 History of uterine scar from previous surgery: Secondary | ICD-10-CM

## 2020-04-09 NOTE — Patient Instructions (Signed)

## 2020-04-09 NOTE — Progress Notes (Signed)
   PRENATAL VISIT NOTE  Subjective:  Jennifer Ross is a 34 y.o. [phone] at [redacted]w[redacted]d being seen today for ongoing prenatal care.  She is currently monitored for the following issues for this low-risk pregnancy and has Supervision of other normal pregnancy, antepartum; History of low transverse cesarean section; Request for sterilization; Fractures involving multiple body regions; Thalassemia alpha carrier; AMA (advanced maternal age) multigravida 35+; Constipation during pregnancy in second trimester; Hemorrhoids; Maternal iron deficiency anemia complicating pregnancy in third trimester; Carotid artery dissection (HCC); and Atlantoaxial dislocation on their problem list.  Patient reports no complaints.  Contractions: Irritability. Vag. Bleeding: None.  Movement: Present. Denies leaking of fluid.   The following portions of the patient's history were reviewed and updated as appropriate: allergies, current medications, past family history, past medical history, past social history, past surgical history and problem list.   Objective:   Vitals:   04/09/20 1624  BP: 112/75  Pulse: (!) 106  Weight: 205 lb (93 kg)    Fetal Status: Fetal Heart Rate (bpm): 143 Fundal Height: 34 cm Movement: Present     General:  Alert, oriented and cooperative. Patient is in no acute distress.  Skin: Skin is warm and dry. No rash noted.   Cardiovascular: Normal heart rate noted  Respiratory: Normal respiratory effort, no problems with respiration noted  Abdomen: Soft, gravid, appropriate for gestational age.  Pain/Pressure: Absent     Pelvic: Cervical exam deferred        Extremities: Normal range of motion.  Edema: Trace  Mental Status: Normal mood and affect. Normal behavior. Normal judgment and thought content.   Assessment and Plan:  Pregnancy: [id number] at [redacted]w[redacted]d 1. Supervision of other normal pregnancy, antepartum Continue routine prenatal care. Cultures next week  2. History of low transverse cesarean  section Considering TOLAC if prior to 39 wks.  Preterm labor symptoms and general obstetric precautions including but not limited to vaginal bleeding, contractions, leaking of fluid and fetal movement were reviewed in detail with the patient. Please refer to After Visit Summary for other counseling recommendations.   Return in 2 weeks (on 04/23/2020).  Future Appointments  Date Time Provider Department Center  04/23/2020 10:00 AM [place] Bing, MD [facility]  04/27/2020 11:00 AM Kathreen Cosier, LCSW [facility] None  04/30/2020 10:00 AM Anyanwu, Jethro Bastos, MD [facility]  05/07/2020 10:00 AM Reva Bores, MD [facility]  05/14/2020 10:00 AM Reva Bores, MD [facility]    Reva Bores, MD

## 2020-04-20 ENCOUNTER — Ambulatory Visit (INDEPENDENT_AMBULATORY_CARE_PROVIDER_SITE_OTHER): Payer: Medicaid Other | Admitting: Obstetrics and Gynecology

## 2020-04-20 ENCOUNTER — Other Ambulatory Visit (HOSPITAL_COMMUNITY)
Admission: RE | Admit: 2020-04-20 | Discharge: 2020-04-20 | Disposition: A | Discharge status: Home / Self Care | Payer: BC Managed Care – PPO | Source: Ambulatory Visit | Attending: Obstetrics and Gynecology | Admitting: Obstetrics and Gynecology

## 2020-04-20 ENCOUNTER — Other Ambulatory Visit: Payer: Self-pay

## 2020-04-20 VITALS — BP 118/77 | HR 112 | Wt 208.0 lb

## 2020-04-20 DIAGNOSIS — Z98891 History of uterine scar from previous surgery: Secondary | ICD-10-CM

## 2020-04-20 DIAGNOSIS — Z3480 Encounter for supervision of other normal pregnancy, unspecified trimester: Secondary | ICD-10-CM | POA: Diagnosis present

## 2020-04-20 DIAGNOSIS — Z3A36 36 weeks gestation of pregnancy: Secondary | ICD-10-CM | POA: Diagnosis present

## 2020-04-20 DIAGNOSIS — Z302 Encounter for sterilization: Secondary | ICD-10-CM

## 2020-04-20 DIAGNOSIS — O09523 Supervision of elderly multigravida, third trimester: Secondary | ICD-10-CM

## 2020-04-21 NOTE — Progress Notes (Signed)
Prenatal Visit Note Date: 04/20/2020 Clinic: [facility]  Subjective:  Jennifer Ross is a 34 y.o. [phone] at [redacted]w[redacted]d being seen today for ongoing prenatal care.  She is currently monitored for the following issues for this high-risk pregnancy and has Supervision of other normal pregnancy, antepartum; History of low transverse cesarean section; Request for sterilization; Fractures involving multiple body regions; Thalassemia alpha carrier; AMA (advanced maternal age) multigravida 35+; Constipation during pregnancy in second trimester; Hemorrhoids; Maternal iron deficiency anemia complicating pregnancy in third trimester; Carotid artery dissection (HCC); and Atlantoaxial dislocation on their problem list.  Patient reports b/l LE edema, low belly pressure.   Contractions: Irregular. Vag. Bleeding: None.  Movement: Present. Denies leaking of fluid.   The following portions of the patient's history were reviewed and updated as appropriate: allergies, current medications, past family history, past medical history, past social history, past surgical history and problem list. Problem list updated.  Objective:   Vitals:   04/20/20 1511  BP: 118/77  Pulse: (!) 112  Weight: 208 lb (94.3 kg)    Fetal Status: Fetal Heart Rate (bpm): 146 Fundal Height: 36 cm Movement: Present     General:  Alert, oriented and cooperative. Patient is in no acute distress.  Skin: Skin is warm and dry. No rash noted.   Cardiovascular: Normal heart rate noted  Respiratory: Normal respiratory effort, no problems with respiration noted  Abdomen: Soft, gravid, appropriate for gestational age. Pain/Pressure: Present     Pelvic:  Cervical exam performed Dilation: Fingertip Effacement (%): Thick Station: Ballotable  Extremities: Normal range of motion.  Edema: Mild pitting, slight indentation  Mental Status: Normal mood and affect. Normal behavior. Normal judgment and thought content.   Urinalysis:       Assessment and Plan:  Pregnancy: [id number] at [redacted]w[redacted]d  1. Supervision of other normal pregnancy, antepartum Routine care - Strep Gp B NAA - GC/Chlamydia probe amp ([place])not at [facility]  2. [redacted] weeks gestation of pregnancy - Strep Gp B NAA - GC/Chlamydia probe amp ([place])not at [facility]  3. History of low transverse cesarean section Has rpt and btl scheduled for 10/27  4. Request for sterilization btl papers utd  5. Multigravida of advanced maternal age in third trimester  Preterm labor symptoms and general obstetric precautions including but not limited to vaginal bleeding, contractions, leaking of fluid and fetal movement were reviewed in detail with the patient. Please refer to After Visit Summary for other counseling recommendations.  Return in about 1 week (around 04/27/2020) for already scheduled.   [place] Bing, MD

## 2020-04-22 LAB — STREP GP B NAA: Strep Gp B NAA: POSITIVE — AB

## 2020-04-22 LAB — GC/CHLAMYDIA PROBE AMP (~~LOC~~) NOT AT ARMC
Chlamydia: NEGATIVE
Comment: NEGATIVE
Comment: NORMAL
Neisseria Gonorrhea: NEGATIVE

## 2020-04-23 ENCOUNTER — Encounter: Payer: Medicaid Other | Admitting: Obstetrics and Gynecology

## 2020-04-27 ENCOUNTER — Encounter: Payer: Self-pay | Admitting: *Deleted

## 2020-04-27 ENCOUNTER — Encounter: Payer: Self-pay | Admitting: Obstetrics and Gynecology

## 2020-04-27 ENCOUNTER — Ambulatory Visit: Payer: Medicaid Other | Admitting: Licensed Clinical Social Worker

## 2020-04-27 ENCOUNTER — Ambulatory Visit: Payer: Self-pay | Admitting: Licensed Clinical Social Worker

## 2020-04-27 DIAGNOSIS — O99820 Streptococcus B carrier state complicating pregnancy: Secondary | ICD-10-CM | POA: Insufficient documentation

## 2020-04-27 DIAGNOSIS — F331 Major depressive disorder, recurrent, moderate: Secondary | ICD-10-CM

## 2020-04-27 NOTE — Progress Notes (Signed)
Counselor/Therapist Progress Note  Patient ID: Jennifer Ross, MRN: [id number],    Date: 04/27/2020  Time Spent: 20 minutes   Treatment Type: Individual Therapy  Reported Symptoms: mood improvement   Mental Status Exam:  Appearance:   Casual and Well Groomed     Behavior:  Appropriate and Sharing  Motor:  Normal  Speech/Language:   Normal Rate  Affect:  Appropriate and Congruent  Mood:  normal  Thought process:  normal  Thought content:    WNL  Sensory/Perceptual disturbances:    WNL  Orientation:  oriented to person, place, time/date, situation and day of week  Attention:  Good  Concentration:  Good  Memory:  WNL  Fund of knowledge:   Good  Insight:    Good  Judgment:   Good  Impulse Control:  Good   Risk Assessment: Danger to Self:  No Self-injurious Behavior: No Danger to Others: No Duty to Warn:no Physical Aggression / Violence:No  Access to Firearms a concern: No  Gang Involvement:No   Subjective:  Patient was cooperative in the session. Patient voices that her mood has improved and she is not in need of further services at this time. Patient agreed to two week postpartum mood check.   Interventions: Psychoeducation Established psychological safety. Checked in with patient and reviewed previous session, including assessment and goal of treatment. Discussed patient's mood improvement and desire to terminate services. Provided brief psychoeducation on postpartum mood and anxiety disorders and asked for patient's consent to LCSW conducting two week postpartum mood check. LCSW encouraged patient to reach out in the future as needed.   Diagnosis:   ICD-10-CM   1. Major depressive disorder, recurrent episode, moderate (HCC)  F33.1     Plan: Patient reports that her mood has improved and does not need services at this time. She reports that she will reach out if she needs services in the future. However, patient did agree to a 2 week postpartum mood check.   Future  Appointments  Date Time Provider Department Center  04/30/2020 10:00 AM Anyanwu, Jethro Bastos, MD [facility]  05/07/2020 10:00 AM Reva Bores, MD [facility]  05/14/2020 10:00 AM Reva Bores, MD [facility]    Interpreter used: NA  Kathreen Cosier, LCSW

## 2020-04-28 ENCOUNTER — Encounter: Payer: Self-pay | Admitting: *Deleted

## 2020-04-29 ENCOUNTER — Telehealth (HOSPITAL_COMMUNITY): Payer: Self-pay | Admitting: *Deleted

## 2020-04-29 NOTE — Telephone Encounter (Signed)
Preadmission screen  

## 2020-04-29 NOTE — Patient Instructions (Signed)
Jennifer Ross  04/29/2020   Your procedure is scheduled on:  05/13/2020  Arrive at 0730 at [employer] [facility] on [address] at [facility]  and [employer]. You are invited to use the FREE valet parking or use the Visitor's parking deck.  Pick up the phone at the desk and dial [phone].  Call this number if you have problems the morning of surgery: [phone]  Remember:   Do not eat food:(After Midnight) Desps de medianoche.  Do not drink clear liquids: (After Midnight) Desps de medianoche.  Take these medicines the morning of surgery with A SIP OF WATER:  none   Do not wear jewelry, make-up or nail polish.  Do not wear lotions, powders, or perfumes. Do not wear deodorant.  Do not shave 48 hours prior to surgery.  Do not bring valuables to the hospital.  [facility] is not   responsible for any belongings or valuables brought to the hospital.  Contacts, dentures or bridgework may not be worn into surgery.  Leave suitcase in the car. After surgery it may be brought to your room.  For patients admitted to the hospital, checkout time is 11:00 AM the day of              discharge.      Please read over the following fact sheets that you were given:     Preparing for Surgery

## 2020-04-30 ENCOUNTER — Other Ambulatory Visit: Payer: Self-pay

## 2020-04-30 ENCOUNTER — Encounter: Payer: Self-pay | Admitting: Obstetrics & Gynecology

## 2020-04-30 ENCOUNTER — Telehealth (HOSPITAL_COMMUNITY): Payer: Self-pay | Admitting: *Deleted

## 2020-04-30 ENCOUNTER — Telehealth (INDEPENDENT_AMBULATORY_CARE_PROVIDER_SITE_OTHER): Payer: Medicaid Other | Admitting: Obstetrics & Gynecology

## 2020-04-30 DIAGNOSIS — Z148 Genetic carrier of other disease: Secondary | ICD-10-CM

## 2020-04-30 DIAGNOSIS — B951 Streptococcus, group B, as the cause of diseases classified elsewhere: Secondary | ICD-10-CM

## 2020-04-30 DIAGNOSIS — O34219 Maternal care for unspecified type scar from previous cesarean delivery: Secondary | ICD-10-CM

## 2020-04-30 DIAGNOSIS — O99013 Anemia complicating pregnancy, third trimester: Secondary | ICD-10-CM

## 2020-04-30 DIAGNOSIS — Z3A37 37 weeks gestation of pregnancy: Secondary | ICD-10-CM

## 2020-04-30 DIAGNOSIS — D509 Iron deficiency anemia, unspecified: Secondary | ICD-10-CM

## 2020-04-30 DIAGNOSIS — Z3480 Encounter for supervision of other normal pregnancy, unspecified trimester: Secondary | ICD-10-CM

## 2020-04-30 DIAGNOSIS — O99820 Streptococcus B carrier state complicating pregnancy: Secondary | ICD-10-CM

## 2020-04-30 DIAGNOSIS — Z98891 History of uterine scar from previous surgery: Secondary | ICD-10-CM

## 2020-04-30 NOTE — Progress Notes (Signed)
I connected with  Jennifer Ross on 04/30/20 at 10:00 AM EDT by telephone and verified that I am speaking with the correct person using two identifiers.   I discussed the limitations, risks, security and privacy concerns of performing an evaluation and management service by telephone and the availability of in person appointments. I also discussed with the patient that there may be a patient responsible charge related to this service. The patient expressed understanding and agreed to proceed.  Scheryl Marten, RN 04/30/2020  10:24 AM   Pt will take BP at home tonight and send it mychart

## 2020-04-30 NOTE — Telephone Encounter (Signed)
Preadmission screen  

## 2020-04-30 NOTE — Patient Instructions (Signed)
Return to office for any scheduled appointments. Call the office or go to the [facility] [place] if:  You begin to have strong, frequent contractions  Your water breaks.  Sometimes it is a big gush of fluid, sometimes it is just a trickle that keeps getting your panties wet or running down your legs  You have vaginal bleeding.  It is normal to have a small amount of spotting if your cervix was checked.   You do not feel your baby moving like normal.  If you do not, get something to eat and drink and lay down and focus on feeling your baby move.   If your baby is still not moving like normal, you should call the office or go to MAU.  Any other obstetric concerns.   

## 2020-04-30 NOTE — Progress Notes (Signed)
   OBSTETRICS PRENATAL VIRTUAL VISIT ENCOUNTER NOTE  Provider location: [facility]   I connected with Jennifer Ross on 04/30/20 at 10:00 AM EDT by MyChart Video Encounter at home and verified that I am speaking with the correct person using two identifiers.   I discussed the limitations, risks, security and privacy concerns of performing an evaluation and management service virtually and the availability of in person appointments. I also discussed with the patient that there may be a patient responsible charge related to this service. The patient expressed understanding and agreed to proceed. Subjective:  Jennifer Ross is a 34 y.o. [phone] at [redacted]w[redacted]d being seen today for ongoing prenatal care.  She is currently monitored for the following issues for this low-risk pregnancy and has Supervision of other normal pregnancy, antepartum; History of low transverse cesarean section; Request for sterilization; Fractures involving multiple body regions; Thalassemia alpha carrier; AMA (advanced maternal age) multigravida 35+; Constipation during pregnancy in second trimester; Hemorrhoids; Maternal iron deficiency anemia complicating pregnancy in third trimester; Carotid artery dissection (HCC); Atlantoaxial dislocation; and GBS (group B Streptococcus carrier), +RV culture, currently pregnant on their problem list.  Patient reports occasional contractions.  Contractions: Irritability. Vag. Bleeding: None.  Movement: Present. Denies any leaking of fluid.   The following portions of the patient's history were reviewed and updated as appropriate: allergies, current medications, past family history, past medical history, past social history, past surgical history and problem list.   Objective:  There were no vitals filed for this visit.  Fetal Status:     Movement: Present     General:  Alert, oriented and cooperative. Patient is in no acute distress.  Respiratory: Normal  respiratory effort, no problems with respiration noted  Mental Status: Normal mood and affect. Normal behavior. Normal judgment and thought content.  Rest of physical exam deferred due to type of encounter  Imaging: No results found.  Assessment and Plan:  Pregnancy: [id number] at [redacted]w[redacted]d 1. History of low transverse cesarean section Desires TOLAC, consent signed. But RCS scheduled for 10/27 if no labor by then.   2. [redacted] weeks gestation of pregnancy 3. Supervision of other normal pregnancy, antepartum GBS positive, intrapartum prophylaxis needed. Labor symptoms and general obstetric precautions including but not limited to vaginal bleeding, contractions, leaking of fluid and fetal movement were reviewed in detail with the patient. I discussed the assessment and treatment plan with the patient. The patient was provided an opportunity to ask questions and all were answered. The patient agreed with the plan and demonstrated an understanding of the instructions. The patient was advised to call back or seek an in-person office evaluation/go to MAU at [facility] for any urgent or concerning symptoms. Please refer to After Visit Summary for other counseling recommendations.   I provided 10 minutes of face-to-face time during this encounter.  Return in about 1 week (around 05/07/2020) for OFFICE OB Visit.  Future Appointments  Date Time Provider Department Center  05/07/2020 10:00 AM Reva Bores, MD [facility]  05/11/2020 10:45 AM [facility] None  05/11/2020 11:30 AM [facility] PAT 1 [facility] None  05/14/2020 10:00 AM Reva Bores, MD [facility]    Jaynie Collins, MD [facility], [facility]

## 2020-05-01 ENCOUNTER — Encounter (HOSPITAL_COMMUNITY): Payer: Self-pay

## 2020-05-05 ENCOUNTER — Ambulatory Visit (INDEPENDENT_AMBULATORY_CARE_PROVIDER_SITE_OTHER): Payer: BC Managed Care – PPO | Admitting: *Deleted

## 2020-05-05 ENCOUNTER — Encounter: Payer: Self-pay | Admitting: *Deleted

## 2020-05-05 ENCOUNTER — Other Ambulatory Visit: Payer: Self-pay

## 2020-05-05 VITALS — BP 104/72 | HR 105

## 2020-05-05 DIAGNOSIS — Z3480 Encounter for supervision of other normal pregnancy, unspecified trimester: Secondary | ICD-10-CM | POA: Diagnosis not present

## 2020-05-05 NOTE — Progress Notes (Signed)
Pt states she has felt decreased movement for the past 2 days. Placed on NST.  Tracing reviewed by Dr Macon Large, reassuring and reactive. Pt has ROB on 10/21.

## 2020-05-05 NOTE — Progress Notes (Signed)
Patient was assessed and managed by nursing staff during this encounter. I have reviewed the chart and agree with the documentation and plan. NST performed today was reviewed and was found to be reactive.  Continue recommended antenatal testing and prenatal care.   Jaynie Collins, MD 05/05/2020 3:41 PM

## 2020-05-07 ENCOUNTER — Encounter: Payer: Self-pay | Admitting: Radiology

## 2020-05-07 ENCOUNTER — Ambulatory Visit (INDEPENDENT_AMBULATORY_CARE_PROVIDER_SITE_OTHER): Payer: Medicaid Other | Admitting: Family Medicine

## 2020-05-07 ENCOUNTER — Other Ambulatory Visit: Payer: Self-pay

## 2020-05-07 VITALS — BP 120/81 | HR 99 | Wt 209.0 lb

## 2020-05-07 DIAGNOSIS — Z302 Encounter for sterilization: Secondary | ICD-10-CM

## 2020-05-07 DIAGNOSIS — O09523 Supervision of elderly multigravida, third trimester: Secondary | ICD-10-CM

## 2020-05-07 DIAGNOSIS — Z98891 History of uterine scar from previous surgery: Secondary | ICD-10-CM

## 2020-05-07 DIAGNOSIS — Z3480 Encounter for supervision of other normal pregnancy, unspecified trimester: Secondary | ICD-10-CM

## 2020-05-08 ENCOUNTER — Other Ambulatory Visit (HOSPITAL_COMMUNITY): Payer: Self-pay | Admitting: Advanced Practice Midwife

## 2020-05-08 NOTE — Addendum Note (Signed)
Addended by: Reva Bores on: 05/08/2020 07:12 PM   Modules accepted: Orders, SmartSet

## 2020-05-08 NOTE — Progress Notes (Signed)
   PRENATAL VISIT NOTE  Subjective:  Jennifer Ross is a 34 y.o. [phone] at [redacted]w[redacted]d being seen today for ongoing prenatal care.  She is currently monitored for the following issues for this high-risk pregnancy and has Supervision of other normal pregnancy, antepartum; History of low transverse cesarean section; Request for sterilization; Fractures involving multiple body regions; Thalassemia alpha carrier; AMA (advanced maternal age) multigravida 35+; Constipation during pregnancy in second trimester; Hemorrhoids; Maternal iron deficiency anemia complicating pregnancy in third trimester; Carotid artery dissection (HCC); Atlantoaxial dislocation; and GBS (group B Streptococcus carrier), +RV culture, currently pregnant on their problem list.  Patient reports no complaints.  Contractions: Irregular. Vag. Bleeding: None.  Movement: Present. Denies leaking of fluid.   The following portions of the patient's history were reviewed and updated as appropriate: allergies, current medications, past family history, past medical history, past social history, past surgical history and problem list.   Objective:   Vitals:   05/07/20 1024  BP: 120/81  Pulse: 99  Weight: 209 lb (94.8 kg)    Fetal Status: Fetal Heart Rate (bpm): 144 Fundal Height: 35 cm Movement: Present  Presentation: Vertex  General:  Alert, oriented and cooperative. Patient is in no acute distress.  Skin: Skin is warm and dry. No rash noted.   Cardiovascular: Normal heart rate noted  Respiratory: Normal respiratory effort, no problems with respiration noted  Abdomen: Soft, gravid, appropriate for gestational age.  Pain/Pressure: Present     Pelvic: Cervical exam performed in the presence of a chaperone Dilation: 1 Effacement (%): 30 Station: -2  Extremities: Normal range of motion.  Edema: Mild pitting, slight indentation  Mental Status: Normal mood and affect. Normal behavior. Normal judgment and thought content.   Assessment and Plan:   Pregnancy: [id number] at [redacted]w[redacted]d 1. Supervision of other normal pregnancy, antepartum Continue routine prenatal care.   2. Request for sterilization   3. Multigravida of advanced maternal age in third trimester   4. History of low transverse cesarean section Has decided on trial of labor.  Have canceled her C-section, and booked for midnight induction with Foley balloon.  She has a history of pelvic fractures I have reviewed all imaging from that time which did involve mostly pubic rami fractures and through the acetabulum. Should be fine to labor.  Term labor symptoms and general obstetric precautions including but not limited to vaginal bleeding, contractions, leaking of fluid and fetal movement were reviewed in detail with the patient. Please refer to After Visit Summary for other counseling recommendations.   Return in 1 week (on 05/14/2020).  Future Appointments  Date Time Provider Department Center  05/11/2020 10:45 AM [facility]-SCREENING [facility] None  05/13/2020 12:00 AM [facility]-LD SCHED ROOM [facility] None  06/17/2020  1:15 PM Federico Flake, MD [facility]    Reva Bores, MD

## 2020-05-10 ENCOUNTER — Encounter (HOSPITAL_COMMUNITY): Payer: Self-pay | Admitting: Anesthesiology

## 2020-05-10 ENCOUNTER — Encounter (HOSPITAL_COMMUNITY): Payer: Self-pay | Admitting: Obstetrics and Gynecology

## 2020-05-10 ENCOUNTER — Inpatient Hospital Stay (HOSPITAL_COMMUNITY)
Admission: AD | Admit: 2020-05-10 | Discharge: 2020-05-14 | DRG: 788 | Disposition: A | Discharge status: Home / Self Care | Payer: BC Managed Care – PPO | Attending: Obstetrics & Gynecology | Admitting: Obstetrics & Gynecology

## 2020-05-10 ENCOUNTER — Other Ambulatory Visit: Payer: Self-pay

## 2020-05-10 DIAGNOSIS — Z3A39 39 weeks gestation of pregnancy: Secondary | ICD-10-CM | POA: Diagnosis not present

## 2020-05-10 DIAGNOSIS — Z3490 Encounter for supervision of normal pregnancy, unspecified, unspecified trimester: Secondary | ICD-10-CM | POA: Diagnosis present

## 2020-05-10 DIAGNOSIS — Z8781 Personal history of (healed) traumatic fracture: Secondary | ICD-10-CM | POA: Diagnosis not present

## 2020-05-10 DIAGNOSIS — R061 Stridor: Secondary | ICD-10-CM | POA: Diagnosis not present

## 2020-05-10 DIAGNOSIS — Z302 Encounter for sterilization: Secondary | ICD-10-CM

## 2020-05-10 DIAGNOSIS — O99013 Anemia complicating pregnancy, third trimester: Secondary | ICD-10-CM | POA: Diagnosis present

## 2020-05-10 DIAGNOSIS — D563 Thalassemia minor: Secondary | ICD-10-CM | POA: Diagnosis present

## 2020-05-10 DIAGNOSIS — O9902 Anemia complicating childbirth: Secondary | ICD-10-CM | POA: Diagnosis present

## 2020-05-10 DIAGNOSIS — O368130 Decreased fetal movements, third trimester, not applicable or unspecified: Principal | ICD-10-CM | POA: Diagnosis present

## 2020-05-10 DIAGNOSIS — T884XXA Failed or difficult intubation, initial encounter: Secondary | ICD-10-CM | POA: Diagnosis not present

## 2020-05-10 DIAGNOSIS — O99824 Streptococcus B carrier state complicating childbirth: Secondary | ICD-10-CM | POA: Diagnosis present

## 2020-05-10 DIAGNOSIS — Z0371 Encounter for suspected problem with amniotic cavity and membrane ruled out: Secondary | ICD-10-CM | POA: Diagnosis present

## 2020-05-10 DIAGNOSIS — O99820 Streptococcus B carrier state complicating pregnancy: Secondary | ICD-10-CM

## 2020-05-10 DIAGNOSIS — Z3480 Encounter for supervision of other normal pregnancy, unspecified trimester: Secondary | ICD-10-CM

## 2020-05-10 DIAGNOSIS — M542 Cervicalgia: Secondary | ICD-10-CM | POA: Diagnosis not present

## 2020-05-10 DIAGNOSIS — O09529 Supervision of elderly multigravida, unspecified trimester: Secondary | ICD-10-CM

## 2020-05-10 DIAGNOSIS — R42 Dizziness and giddiness: Secondary | ICD-10-CM | POA: Diagnosis not present

## 2020-05-10 DIAGNOSIS — O99893 Other specified diseases and conditions complicating puerperium: Secondary | ICD-10-CM | POA: Diagnosis not present

## 2020-05-10 DIAGNOSIS — O9A22 Injury, poisoning and certain other consequences of external causes complicating childbirth: Secondary | ICD-10-CM | POA: Diagnosis present

## 2020-05-10 DIAGNOSIS — O368131 Decreased fetal movements, third trimester, fetus 1: Secondary | ICD-10-CM | POA: Diagnosis present

## 2020-05-10 DIAGNOSIS — Z98891 History of uterine scar from previous surgery: Secondary | ICD-10-CM

## 2020-05-10 DIAGNOSIS — O4202 Full-term premature rupture of membranes, onset of labor within 24 hours of rupture: Secondary | ICD-10-CM | POA: Diagnosis not present

## 2020-05-10 DIAGNOSIS — T07XXXA Unspecified multiple injuries, initial encounter: Secondary | ICD-10-CM | POA: Diagnosis present

## 2020-05-10 DIAGNOSIS — Z20822 Contact with and (suspected) exposure to covid-19: Secondary | ICD-10-CM | POA: Diagnosis present

## 2020-05-10 DIAGNOSIS — I7771 Dissection of carotid artery: Secondary | ICD-10-CM | POA: Diagnosis present

## 2020-05-10 DIAGNOSIS — D509 Iron deficiency anemia, unspecified: Secondary | ICD-10-CM | POA: Diagnosis present

## 2020-05-10 DIAGNOSIS — O34211 Maternal care for low transverse scar from previous cesarean delivery: Secondary | ICD-10-CM | POA: Diagnosis present

## 2020-05-10 DIAGNOSIS — O4693 Antepartum hemorrhage, unspecified, third trimester: Secondary | ICD-10-CM | POA: Diagnosis present

## 2020-05-10 DIAGNOSIS — S13121A Dislocation of C1/C2 cervical vertebrae, initial encounter: Secondary | ICD-10-CM | POA: Diagnosis present

## 2020-05-10 LAB — CBC
HCT: 39.1 % (ref 36.0–46.0)
Hemoglobin: 11.9 g/dL — ABNORMAL LOW (ref 12.0–15.0)
MCH: 22.6 pg — ABNORMAL LOW (ref 26.0–34.0)
MCHC: 30.4 g/dL (ref 30.0–36.0)
MCV: 74.3 fL — ABNORMAL LOW (ref 80.0–100.0)
Platelets: 173 10*3/uL (ref 150–400)
RBC: 5.26 MIL/uL — ABNORMAL HIGH (ref 3.87–5.11)
RDW: 15.9 % — ABNORMAL HIGH (ref 11.5–15.5)
WBC: 6.5 10*3/uL (ref 4.0–10.5)
nRBC: 0 % (ref 0.0–0.2)

## 2020-05-10 LAB — RESPIRATORY PANEL BY RT PCR (FLU A&B, COVID)
Influenza A by PCR: NEGATIVE
Influenza B by PCR: NEGATIVE
SARS Coronavirus 2 by RT PCR: NEGATIVE

## 2020-05-10 LAB — TYPE AND SCREEN
ABO/RH(D): O POS
Antibody Screen: NEGATIVE

## 2020-05-10 MED ORDER — OXYTOCIN BOLUS FROM INFUSION: 333.0000 mL | Freq: Once | INTRAVENOUS | Status: DC

## 2020-05-10 MED ORDER — SODIUM CHLORIDE 0.9 % IV SOLN
5.0000 10*6.[IU] | Freq: Once | INTRAVENOUS | Status: AC
  Administered 2020-05-10: 5 10*6.[IU] via INTRAVENOUS
  Filled 2020-05-10: qty 5

## 2020-05-10 MED ORDER — OXYTOCIN-SODIUM CHLORIDE 30-0.9 UT/500ML-% IV SOLN: 2.5000 [IU]/h | INTRAVENOUS | Status: DC

## 2020-05-10 MED ORDER — FENTANYL CITRATE (PF) 100 MCG/2ML IJ SOLN
INTRAMUSCULAR | Status: AC
Start: 2020-05-10 — End: 2020-05-11
  Filled 2020-05-10: qty 2

## 2020-05-10 MED ORDER — PHENYLEPHRINE 40 MCG/ML (10ML) SYRINGE FOR IV PUSH (FOR BLOOD PRESSURE SUPPORT)
80.0000 ug | PREFILLED_SYRINGE | INTRAVENOUS | Status: DC | PRN
  Filled 2020-05-10: qty 10

## 2020-05-10 MED ORDER — FENTANYL-BUPIVACAINE-NACL 0.5-0.125-0.9 MG/250ML-% EP SOLN
12.0000 mL/h | EPIDURAL | Status: DC | PRN
  Filled 2020-05-10: qty 250

## 2020-05-10 MED ORDER — FLEET ENEMA 7-19 GM/118ML RE ENEM: 1.0000 | ENEMA | RECTAL | Status: DC | PRN

## 2020-05-10 MED ORDER — EPHEDRINE 5 MG/ML INJ: 10.0000 mg | INTRAVENOUS | Status: DC | PRN

## 2020-05-10 MED ORDER — FENTANYL CITRATE (PF) 100 MCG/2ML IJ SOLN
100.0000 ug | INTRAMUSCULAR | Status: DC | PRN
  Administered 2020-05-10 (×4): 100 ug via INTRAVENOUS
  Filled 2020-05-10 (×3): qty 2

## 2020-05-10 MED ORDER — LACTATED RINGERS IV SOLN: INTRAVENOUS | Status: DC

## 2020-05-10 MED ORDER — DIPHENHYDRAMINE HCL 50 MG/ML IJ SOLN: 12.5000 mg | INTRAMUSCULAR | Status: DC | PRN

## 2020-05-10 MED ORDER — OXYTOCIN-SODIUM CHLORIDE 30-0.9 UT/500ML-% IV SOLN
1.0000 m[IU]/min | INTRAVENOUS | Status: DC
  Administered 2020-05-10: 2 m[IU]/min via INTRAVENOUS
  Filled 2020-05-10: qty 500

## 2020-05-10 MED ORDER — OXYCODONE-ACETAMINOPHEN 5-325 MG PO TABS: 2.0000 | ORAL_TABLET | ORAL | Status: DC | PRN

## 2020-05-10 MED ORDER — SOD CITRATE-CITRIC ACID 500-334 MG/5ML PO SOLN: 30.0000 mL | ORAL | Status: DC | PRN

## 2020-05-10 MED ORDER — LIDOCAINE HCL (PF) 1 % IJ SOLN: 30.0000 mL | INTRAMUSCULAR | Status: DC | PRN

## 2020-05-10 MED ORDER — LACTATED RINGERS IV SOLN
500.0000 mL | Freq: Once | INTRAVENOUS | Status: AC
  Administered 2020-05-10: 500 mL via INTRAVENOUS

## 2020-05-10 MED ORDER — TERBUTALINE SULFATE 1 MG/ML IJ SOLN
0.2500 mg | Freq: Once | INTRAMUSCULAR | Status: AC | PRN
  Administered 2020-05-11 (×2): 0.25 mg via SUBCUTANEOUS
  Filled 2020-05-10: qty 1

## 2020-05-10 MED ORDER — ACETAMINOPHEN 325 MG PO TABS: 650.0000 mg | ORAL_TABLET | ORAL | Status: DC | PRN

## 2020-05-10 MED ORDER — PHENYLEPHRINE 40 MCG/ML (10ML) SYRINGE FOR IV PUSH (FOR BLOOD PRESSURE SUPPORT)
80.0000 ug | PREFILLED_SYRINGE | INTRAVENOUS | Status: DC | PRN
  Administered 2020-05-11 (×2): 80 ug via INTRAVENOUS
  Filled 2020-05-10: qty 10

## 2020-05-10 MED ORDER — OXYCODONE-ACETAMINOPHEN 5-325 MG PO TABS: 1.0000 | ORAL_TABLET | ORAL | Status: DC | PRN

## 2020-05-10 MED ORDER — LACTATED RINGERS IV SOLN
500.0000 mL | INTRAVENOUS | Status: DC | PRN
  Administered 2020-05-11: 500 mL via INTRAVENOUS

## 2020-05-10 MED ORDER — PENICILLIN G POT IN DEXTROSE 60000 UNIT/ML IV SOLN
3.0000 10*6.[IU] | INTRAVENOUS | Status: DC
  Administered 2020-05-10 – 2020-05-11 (×2): 3 10*6.[IU] via INTRAVENOUS
  Filled 2020-05-10 (×2): qty 50

## 2020-05-10 MED ORDER — ONDANSETRON HCL 4 MG/2ML IJ SOLN
4.0000 mg | Freq: Four times a day (QID) | INTRAMUSCULAR | Status: DC | PRN
  Administered 2020-05-10: 4 mg via INTRAVENOUS
  Filled 2020-05-10: qty 2
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
## 2020-05-10 NOTE — MAU Note (Signed)
Jennifer Ross is a 34 y.o. at [redacted]w[redacted]d here in MAU reporting: states she was at church this AM and saw 3 blood clots and now the bleeding is light. Saw some fluid last night but none today. DFM. Having occasional contractions.  Onset of complaint: today  Pain score: 0/10  Vitals:   05/10/20 1316  BP: 130/78  Pulse: 92  Resp: 20  Temp: 98.6 F (37 C)  SpO2: 100%     FHT: EFM applied in room  Lab orders placed from triage: none

## 2020-05-10 NOTE — Anesthesia Preprocedure Evaluation (Signed)
Anesthesia Evaluation  Patient identified by MRN, date of birth, ID band Patient awake    Reviewed: Allergy & Precautions, Patient's Chart, lab work & pertinent test results  Airway Mallampati: II  TM Distance: >3 FB Neck ROM: Full    Dental no notable dental hx.    Pulmonary neg pulmonary ROS,    Pulmonary exam normal breath sounds clear to auscultation       Cardiovascular negative cardio ROS Normal cardiovascular exam Rhythm:Regular Rate:Normal     Neuro/Psych negative neurological ROS  negative psych ROS   GI/Hepatic negative GI ROS, Neg liver ROS,   Endo/Other  Obesity BMI 38  Renal/GU negative Renal ROS  negative genitourinary   Musculoskeletal negative musculoskeletal ROS (+)   Abdominal (+) + obese,   Peds negative pediatric ROS (+)  Hematology  (+) Blood dyscrasia, anemia , hct 39.1, plt 173   Anesthesia Other Findings   Reproductive/Obstetrics (+) Pregnancy G4P2 TOLAC, c section last delivery for fetal intolerance Was originally scheduled for c section next week but came into MAU with vaginal bleeding and decreased fetal movement at 39 wks- pt decided to [facility] [address] instead of section                             Anesthesia Physical Anesthesia Plan  ASA: III and emergent  Anesthesia Plan: Epidural   Post-op Pain Management:    Induction:   PONV Risk Score and Plan: 2  Airway Management Planned: Natural Airway  Additional Equipment: None  Intra-op Plan:   Post-operative Plan:   Informed Consent: I have reviewed the patients History and Physical, chart, labs and discussed the procedure including the risks, benefits and alternatives for the proposed anesthesia with the patient or authorized representative who has indicated his/her understanding and acceptance.       Plan Discussed with:   Anesthesia Plan Comments:         Anesthesia Quick Evaluation
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
## 2020-05-11 ENCOUNTER — Other Ambulatory Visit (HOSPITAL_COMMUNITY): Payer: Medicaid Other

## 2020-05-11 ENCOUNTER — Inpatient Hospital Stay (HOSPITAL_COMMUNITY)
Admission: RE | Admit: 2020-05-11 | Discharge: 2020-05-11 | Disposition: A | Discharge status: Home / Self Care | Payer: Medicaid Other | Source: Ambulatory Visit

## 2020-05-11 ENCOUNTER — Encounter (HOSPITAL_COMMUNITY)
Admission: AD | Disposition: A | Discharge status: Home / Self Care | Payer: Self-pay | Source: Home / Self Care | Attending: Family Medicine

## 2020-05-11 ENCOUNTER — Encounter (HOSPITAL_COMMUNITY): Payer: Self-pay | Admitting: Obstetrics and Gynecology

## 2020-05-11 ENCOUNTER — Inpatient Hospital Stay (HOSPITAL_COMMUNITY): Payer: BC Managed Care – PPO | Admitting: Anesthesiology

## 2020-05-11 DIAGNOSIS — Z3A39 39 weeks gestation of pregnancy: Secondary | ICD-10-CM

## 2020-05-11 DIAGNOSIS — O368130 Decreased fetal movements, third trimester, not applicable or unspecified: Secondary | ICD-10-CM

## 2020-05-11 DIAGNOSIS — O4202 Full-term premature rupture of membranes, onset of labor within 24 hours of rupture: Secondary | ICD-10-CM

## 2020-05-11 DIAGNOSIS — O34211 Maternal care for low transverse scar from previous cesarean delivery: Secondary | ICD-10-CM

## 2020-05-11 HISTORY — DX: Other specified health status: Z78.9

## 2020-05-11 LAB — CBC
HCT: 34.9 % — ABNORMAL LOW (ref 36.0–46.0)
Hemoglobin: 10.3 g/dL — ABNORMAL LOW (ref 12.0–15.0)
MCH: 22.6 pg — ABNORMAL LOW (ref 26.0–34.0)
MCHC: 29.5 g/dL — ABNORMAL LOW (ref 30.0–36.0)
MCV: 76.5 fL — ABNORMAL LOW (ref 80.0–100.0)
Platelets: 154 10*3/uL (ref 150–400)
RBC: 4.56 MIL/uL (ref 3.87–5.11)
RDW: 15.8 % — ABNORMAL HIGH (ref 11.5–15.5)
WBC: 12.6 10*3/uL — ABNORMAL HIGH (ref 4.0–10.5)
nRBC: 0 % (ref 0.0–0.2)

## 2020-05-11 LAB — RPR: RPR Ser Ql: NONREACTIVE

## 2020-05-11 SURGERY — Surgical Case
Anesthesia: Epidural | Laterality: Bilateral

## 2020-05-11 MED ORDER — SIMETHICONE 80 MG PO CHEW
80.0000 mg | CHEWABLE_TABLET | ORAL | Status: DC
  Administered 2020-05-12 – 2020-05-13 (×3): 80 mg via ORAL
  Filled 2020-05-11 (×3): qty 1

## 2020-05-11 MED ORDER — MEPERIDINE HCL 25 MG/ML IJ SOLN: 6.2500 mg | INTRAMUSCULAR | Status: DC | PRN

## 2020-05-11 MED ORDER — KETOROLAC TROMETHAMINE 30 MG/ML IJ SOLN: 30.0000 mg | Freq: Four times a day (QID) | INTRAMUSCULAR | Status: AC | PRN

## 2020-05-11 MED ORDER — DIPHENHYDRAMINE HCL 25 MG PO CAPS: 25.0000 mg | ORAL_CAPSULE | ORAL | Status: DC | PRN

## 2020-05-11 MED ORDER — NALOXONE HCL 4 MG/10ML IJ SOLN
1.0000 ug/kg/h | INTRAVENOUS | Status: DC | PRN
  Filled 2020-05-11: qty 5

## 2020-05-11 MED ORDER — SIMETHICONE 80 MG PO CHEW
80.0000 mg | CHEWABLE_TABLET | Freq: Three times a day (TID) | ORAL | Status: DC
  Administered 2020-05-11 – 2020-05-14 (×8): 80 mg via ORAL
  Filled 2020-05-11 (×8): qty 1

## 2020-05-11 MED ORDER — OXYTOCIN-SODIUM CHLORIDE 30-0.9 UT/500ML-% IV SOLN
INTRAVENOUS | Status: AC
  Filled 2020-05-11: qty 500

## 2020-05-11 MED ORDER — KETOROLAC TROMETHAMINE 30 MG/ML IJ SOLN
INTRAMUSCULAR | Status: AC
  Filled 2020-05-11: qty 1

## 2020-05-11 MED ORDER — OXYCODONE HCL 5 MG PO TABS: 5.0000 mg | ORAL_TABLET | Freq: Once | ORAL | Status: DC | PRN

## 2020-05-11 MED ORDER — ONDANSETRON HCL 4 MG/2ML IJ SOLN
4.0000 mg | Freq: Three times a day (TID) | INTRAMUSCULAR | Status: DC | PRN
  Administered 2020-05-11 – 2020-05-12 (×2): 4 mg via INTRAVENOUS
  Filled 2020-05-11 (×2): qty 2

## 2020-05-11 MED ORDER — DIBUCAINE (PERIANAL) 1 % EX OINT: 1.0000 "application " | TOPICAL_OINTMENT | CUTANEOUS | Status: DC | PRN

## 2020-05-11 MED ORDER — PHENYLEPHRINE HCL-NACL 20-0.9 MG/250ML-% IV SOLN
INTRAVENOUS | Status: DC | PRN
  Administered 2020-05-11: 60 ug/min via INTRAVENOUS

## 2020-05-11 MED ORDER — SOD CITRATE-CITRIC ACID 500-334 MG/5ML PO SOLN
ORAL | Status: AC
  Administered 2020-05-11: 30 mL via ORAL
  Filled 2020-05-11: qty 15

## 2020-05-11 MED ORDER — NALBUPHINE HCL 10 MG/ML IJ SOLN: 5.0000 mg | Freq: Once | INTRAMUSCULAR | Status: DC | PRN

## 2020-05-11 MED ORDER — SIMETHICONE 80 MG PO CHEW: 80.0000 mg | CHEWABLE_TABLET | ORAL | Status: DC | PRN

## 2020-05-11 MED ORDER — ACETAMINOPHEN 500 MG PO TABS
1000.0000 mg | ORAL_TABLET | Freq: Four times a day (QID) | ORAL | Status: AC
  Administered 2020-05-11 – 2020-05-12 (×2): 1000 mg via ORAL
  Filled 2020-05-11 (×2): qty 2

## 2020-05-11 MED ORDER — HYDROMORPHONE HCL 1 MG/ML IJ SOLN
INTRAMUSCULAR | Status: AC
Start: 2020-05-11 — End: ?
  Filled 2020-05-11: qty 0.5

## 2020-05-11 MED ORDER — PHENYLEPHRINE HCL (PRESSORS) 10 MG/ML IV SOLN
INTRAVENOUS | Status: DC | PRN
  Administered 2020-05-11 (×2): 120 ug via INTRAVENOUS
  Administered 2020-05-11: 160 ug via INTRAVENOUS
  Administered 2020-05-11: 80 ug via INTRAVENOUS
  Administered 2020-05-11: 120 ug via INTRAVENOUS

## 2020-05-11 MED ORDER — ONDANSETRON HCL 4 MG/2ML IJ SOLN
INTRAMUSCULAR | Status: DC | PRN
  Administered 2020-05-11: 4 mg via INTRAVENOUS

## 2020-05-11 MED ORDER — CEFAZOLIN SODIUM-DEXTROSE 2-4 GM/100ML-% IV SOLN
INTRAVENOUS | Status: AC
  Filled 2020-05-11: qty 100

## 2020-05-11 MED ORDER — LIDOCAINE-EPINEPHRINE (PF) 2 %-1:200000 IJ SOLN
INTRAMUSCULAR | Status: DC | PRN
  Administered 2020-05-11: 10 mL via EPIDURAL

## 2020-05-11 MED ORDER — TETANUS-DIPHTH-ACELL PERTUSSIS 5-2.5-18.5 LF-MCG/0.5 IM SUSP: 0.5000 mL | Freq: Once | INTRAMUSCULAR | Status: DC

## 2020-05-11 MED ORDER — OXYTOCIN-SODIUM CHLORIDE 30-0.9 UT/500ML-% IV SOLN
INTRAVENOUS | Status: DC | PRN
  Administered 2020-05-11: 30 [IU] via INTRAVENOUS

## 2020-05-11 MED ORDER — WITCH HAZEL-GLYCERIN EX PADS: 1.0000 "application " | MEDICATED_PAD | CUTANEOUS | Status: DC | PRN

## 2020-05-11 MED ORDER — SUCCINYLCHOLINE CHLORIDE 20 MG/ML IJ SOLN
INTRAMUSCULAR | Status: DC | PRN
  Administered 2020-05-11: 120 mg via INTRAVENOUS

## 2020-05-11 MED ORDER — SENNOSIDES-DOCUSATE SODIUM 8.6-50 MG PO TABS
2.0000 | ORAL_TABLET | ORAL | Status: DC
  Administered 2020-05-12 – 2020-05-13 (×3): 2 via ORAL
  Filled 2020-05-11 (×3): qty 2

## 2020-05-11 MED ORDER — PRENATAL MULTIVITAMIN CH
1.0000 | ORAL_TABLET | Freq: Every day | ORAL | Status: DC
  Administered 2020-05-12 – 2020-05-14 (×3): 1 via ORAL
  Filled 2020-05-11 (×2): qty 1

## 2020-05-11 MED ORDER — FENTANYL CITRATE (PF) 100 MCG/2ML IJ SOLN
INTRAMUSCULAR | Status: DC | PRN
  Administered 2020-05-11: 150 ug via INTRAVENOUS
  Administered 2020-05-11: 100 ug via INTRAVENOUS

## 2020-05-11 MED ORDER — PROPOFOL 10 MG/ML IV BOLUS
INTRAVENOUS | Status: DC | PRN
  Administered 2020-05-11: 200 mg via INTRAVENOUS

## 2020-05-11 MED ORDER — PHENYLEPHRINE 40 MCG/ML (10ML) SYRINGE FOR IV PUSH (FOR BLOOD PRESSURE SUPPORT)
PREFILLED_SYRINGE | INTRAVENOUS | Status: AC
  Filled 2020-05-11: qty 10

## 2020-05-11 MED ORDER — PROMETHAZINE HCL 25 MG/ML IJ SOLN: 6.2500 mg | INTRAMUSCULAR | Status: DC | PRN

## 2020-05-11 MED ORDER — MORPHINE SULFATE (PF) 0.5 MG/ML IJ SOLN
INTRAMUSCULAR | Status: DC | PRN
  Administered 2020-05-11: 3 mg via EPIDURAL

## 2020-05-11 MED ORDER — IBUPROFEN 800 MG PO TABS
800.0000 mg | ORAL_TABLET | Freq: Three times a day (TID) | ORAL | Status: AC
  Administered 2020-05-11 – 2020-05-14 (×7): 800 mg via ORAL
  Filled 2020-05-11 (×7): qty 1

## 2020-05-11 MED ORDER — LIDOCAINE HCL (PF) 2 % IJ SOLN
INTRAMUSCULAR | Status: AC
  Filled 2020-05-11: qty 5

## 2020-05-11 MED ORDER — OXYCODONE HCL 5 MG/5ML PO SOLN: 5.0000 mg | Freq: Once | ORAL | Status: DC | PRN

## 2020-05-11 MED ORDER — BUPIVACAINE HCL (PF) 0.5 % IJ SOLN
INTRAMUSCULAR | Status: AC
  Filled 2020-05-11: qty 30

## 2020-05-11 MED ORDER — MIDAZOLAM HCL 2 MG/2ML IJ SOLN
INTRAMUSCULAR | Status: AC
  Filled 2020-05-11: qty 2

## 2020-05-11 MED ORDER — KETOROLAC TROMETHAMINE 30 MG/ML IJ SOLN
INTRAMUSCULAR | Status: DC | PRN
  Administered 2020-05-11: 30 mg via INTRAVENOUS

## 2020-05-11 MED ORDER — LACTATED RINGERS IV SOLN: INTRAVENOUS | Status: DC | PRN

## 2020-05-11 MED ORDER — SODIUM CHLORIDE 0.9 % IR SOLN
Status: DC | PRN
  Administered 2020-05-11: 900 mL

## 2020-05-11 MED ORDER — ROCURONIUM BROMIDE 10 MG/ML (PF) SYRINGE
PREFILLED_SYRINGE | INTRAVENOUS | Status: AC
  Filled 2020-05-11: qty 10

## 2020-05-11 MED ORDER — NALOXONE HCL 0.4 MG/ML IJ SOLN: 0.4000 mg | INTRAMUSCULAR | Status: DC | PRN

## 2020-05-11 MED ORDER — LACTATED RINGERS AMNIOINFUSION: INTRAVENOUS | Status: DC

## 2020-05-11 MED ORDER — SUCCINYLCHOLINE CHLORIDE 200 MG/10ML IV SOSY
PREFILLED_SYRINGE | INTRAVENOUS | Status: AC
  Filled 2020-05-11: qty 10

## 2020-05-11 MED ORDER — MORPHINE SULFATE (PF) 0.5 MG/ML IJ SOLN
INTRAMUSCULAR | Status: AC
  Filled 2020-05-11: qty 10

## 2020-05-11 MED ORDER — SODIUM CHLORIDE 0.9% FLUSH: 3.0000 mL | INTRAVENOUS | Status: DC | PRN

## 2020-05-11 MED ORDER — OXYTOCIN-SODIUM CHLORIDE 30-0.9 UT/500ML-% IV SOLN: 2.5000 [IU]/h | INTRAVENOUS | Status: AC

## 2020-05-11 MED ORDER — HYDROMORPHONE HCL 1 MG/ML IJ SOLN
0.2500 mg | INTRAMUSCULAR | Status: DC | PRN
  Administered 2020-05-11 (×2): 0.5 mg via INTRAVENOUS

## 2020-05-11 MED ORDER — CEFAZOLIN SODIUM-DEXTROSE 2-3 GM-%(50ML) IV SOLR
INTRAVENOUS | Status: DC | PRN
  Administered 2020-05-11: 2 g via INTRAVENOUS

## 2020-05-11 MED ORDER — SODIUM CHLORIDE 0.9 % IR SOLN
Status: DC | PRN
  Administered 2020-05-11: 1000 mL

## 2020-05-11 MED ORDER — SCOPOLAMINE 1 MG/3DAYS TD PT72
1.0000 | MEDICATED_PATCH | Freq: Once | TRANSDERMAL | Status: DC
  Administered 2020-05-12: 1.5 mg via TRANSDERMAL
  Filled 2020-05-11: qty 1

## 2020-05-11 MED ORDER — SUGAMMADEX SODIUM 200 MG/2ML IV SOLN
INTRAVENOUS | Status: DC | PRN
  Administered 2020-05-11: 150 mg via INTRAVENOUS

## 2020-05-11 MED ORDER — PHENYLEPHRINE HCL (PRESSORS) 10 MG/ML IV SOLN: INTRAVENOUS | Status: DC | PRN

## 2020-05-11 MED ORDER — PHENYLEPHRINE HCL-NACL 20-0.9 MG/250ML-% IV SOLN
INTRAVENOUS | Status: AC
  Filled 2020-05-11: qty 250

## 2020-05-11 MED ORDER — HYDROMORPHONE HCL 1 MG/ML IJ SOLN
INTRAMUSCULAR | Status: AC
  Filled 2020-05-11: qty 0.5

## 2020-05-11 MED ORDER — NALBUPHINE HCL 10 MG/ML IJ SOLN: 5.0000 mg | INTRAMUSCULAR | Status: DC | PRN

## 2020-05-11 MED ORDER — KETOROLAC TROMETHAMINE 30 MG/ML IJ SOLN: 30.0000 mg | Freq: Once | INTRAMUSCULAR | Status: DC | PRN

## 2020-05-11 MED ORDER — SODIUM CHLORIDE 0.9 % IV SOLN: INTRAVENOUS | Status: DC | PRN

## 2020-05-11 MED ORDER — MENTHOL 3 MG MT LOZG
1.0000 | LOZENGE | OROMUCOSAL | Status: DC | PRN
  Administered 2020-05-11: 3 mg via ORAL
  Filled 2020-05-11: qty 9

## 2020-05-11 MED ORDER — FENTANYL CITRATE (PF) 250 MCG/5ML IJ SOLN
INTRAMUSCULAR | Status: AC
  Filled 2020-05-11: qty 5

## 2020-05-11 MED ORDER — MIDAZOLAM HCL 2 MG/2ML IJ SOLN
INTRAMUSCULAR | Status: DC | PRN
  Administered 2020-05-11: 2 mg via INTRAVENOUS

## 2020-05-11 MED ORDER — ROCURONIUM BROMIDE 100 MG/10ML IV SOLN
INTRAVENOUS | Status: DC | PRN
  Administered 2020-05-11: 20 mg via INTRAVENOUS

## 2020-05-11 MED ORDER — LIDOCAINE HCL (PF) 1 % IJ SOLN
INTRAMUSCULAR | Status: DC | PRN
  Administered 2020-05-11: 10 mL via EPIDURAL
  Administered 2020-05-11: 2 mL via EPIDURAL

## 2020-05-11 MED ORDER — COCONUT OIL OIL: 1.0000 "application " | TOPICAL_OIL | Status: DC | PRN

## 2020-05-11 MED ORDER — SODIUM CHLORIDE (PF) 0.9 % IJ SOLN
INTRAMUSCULAR | Status: DC | PRN
  Administered 2020-05-11: 12 mL/h via EPIDURAL

## 2020-05-11 MED ORDER — ENOXAPARIN SODIUM 40 MG/0.4ML ~~LOC~~ SOLN
40.0000 mg | SUBCUTANEOUS | Status: DC
  Administered 2020-05-11 – 2020-05-13 (×3): 40 mg via SUBCUTANEOUS
  Filled 2020-05-11 (×3): qty 0.4

## 2020-05-11 MED ORDER — DEXAMETHASONE SODIUM PHOSPHATE 4 MG/ML IJ SOLN
INTRAMUSCULAR | Status: DC | PRN
  Administered 2020-05-11: 4 mg via INTRAVENOUS

## 2020-05-11 MED ORDER — OXYTOCIN-SODIUM CHLORIDE 30-0.9 UT/500ML-% IV SOLN: INTRAVENOUS | Status: DC | PRN

## 2020-05-11 MED ORDER — LACTATED RINGERS IV SOLN: INTRAVENOUS | Status: DC

## 2020-05-11 MED ORDER — DIPHENHYDRAMINE HCL 50 MG/ML IJ SOLN: 12.5000 mg | INTRAMUSCULAR | Status: DC | PRN

## 2020-05-11 MED ORDER — PROPOFOL 10 MG/ML IV BOLUS
INTRAVENOUS | Status: AC
  Filled 2020-05-11: qty 20

## 2020-05-11 MED ORDER — DIPHENHYDRAMINE HCL 25 MG PO CAPS: 25.0000 mg | ORAL_CAPSULE | Freq: Four times a day (QID) | ORAL | Status: DC | PRN

## 2020-05-11 MED ORDER — LACTATED RINGERS IV BOLUS: 1000.0000 mL | Freq: Once | INTRAVENOUS | Status: DC

## 2020-05-11 SURGICAL SUPPLY — 35 items
BENZOIN TINCTURE PRP APPL 2/3 (GAUZE/BANDAGES/DRESSINGS) ×2 IMPLANT
CHLORAPREP W/TINT 26ML (MISCELLANEOUS) ×2 IMPLANT
CLAMP CORD UMBIL (MISCELLANEOUS) IMPLANT
CLOSURE STERI STRIP 1/2 X4 (GAUZE/BANDAGES/DRESSINGS) ×2 IMPLANT
CLOTH BEACON ORANGE TIMEOUT ST (SAFETY) ×2 IMPLANT
DRSG OPSITE POSTOP 4X10 (GAUZE/BANDAGES/DRESSINGS) ×2 IMPLANT
ELECT REM PT RETURN 9FT ADLT (ELECTROSURGICAL) ×2
ELECTRODE REM PT RTRN 9FT ADLT (ELECTROSURGICAL) ×1 IMPLANT
EXTRACTOR VACUUM M CUP 4 TUBE (SUCTIONS) IMPLANT
GLOVE BIOGEL PI IND STRL 7.0 (GLOVE) ×3 IMPLANT
GLOVE BIOGEL PI INDICATOR 7.0 (GLOVE) ×3
GLOVE ECLIPSE 7.0 STRL STRAW (GLOVE) ×2 IMPLANT
GOWN STRL REUS W/TWL LRG LVL3 (GOWN DISPOSABLE) ×4 IMPLANT
HOVERMATT SINGLE USE (MISCELLANEOUS) ×2 IMPLANT
KIT ABG SYR 3ML LUER SLIP (SYRINGE) IMPLANT
NEEDLE HYPO 22GX1.5 SAFETY (NEEDLE) ×2 IMPLANT
NEEDLE HYPO 25X5/8 SAFETYGLIDE (NEEDLE) ×2 IMPLANT
NS IRRIG 1000ML POUR BTL (IV SOLUTION) ×2 IMPLANT
PACK C SECTION WH (CUSTOM PROCEDURE TRAY) ×2 IMPLANT
PAD ABD 7.5X8 STRL (GAUZE/BANDAGES/DRESSINGS) ×2 IMPLANT
PAD OB MATERNITY 4.3X12.25 (PERSONAL CARE ITEMS) ×2 IMPLANT
PENCIL SMOKE EVAC W/HOLSTER (ELECTROSURGICAL) ×2 IMPLANT
RTRCTR C-SECT PINK 25CM LRG (MISCELLANEOUS) IMPLANT
SPONGE LAP 18X18 RF (DISPOSABLE) ×2 IMPLANT
SUT PDS AB 0 CTX 36 PDP370T (SUTURE) IMPLANT
SUT PLAIN 2 0 XLH (SUTURE) IMPLANT
SUT VIC AB 0 CTX 36 (SUTURE) ×2
SUT VIC AB 0 CTX36XBRD ANBCTRL (SUTURE) ×2 IMPLANT
SUT VIC AB 3-0 SH 27 (SUTURE) ×1
SUT VIC AB 3-0 SH 27X BRD (SUTURE) ×1 IMPLANT
SUT VIC AB 4-0 KS 27 (SUTURE) ×4 IMPLANT
SYR CONTROL 10ML LL (SYRINGE) ×2 IMPLANT
TOWEL OR 17X24 6PK STRL BLUE (TOWEL DISPOSABLE) ×2 IMPLANT
TRAY FOLEY W/BAG SLVR 14FR LF (SET/KITS/TRAYS/PACK) ×2 IMPLANT
WATER STERILE IRR 1000ML POUR (IV SOLUTION) ×2 IMPLANT

## 2020-05-11 NOTE — Op Note (Signed)
Jennifer Ross   PROCEDURE DATE: 05/11/2020  PREOPERATIVE DIAGNOSES: Intrauterine pregnancy at [redacted]w[redacted]d weeks gestation; non-reassuring fetal status  POSTOPERATIVE DIAGNOSES: The same  PROCEDURE: STAT Repeat Low Transverse Cesarean Section  SURGEON:  Dr. Nicholaus Corolla, MD            Dr. Lynnda Shields, MD  ANESTHESIOLOGY TEAM: Anesthesiologist: Lannie Fields, DO CRNA: Trellis Paganini, CRNA  INDICATIONS: Jennifer Ross is a 34 y.o. [phone] at [redacted]w[redacted]d here for IOL secondary to decreased fetal movement. Shortly after SROM for clear fluid, fetal bradycardia to 40s. FHT improved to 90s s/p discontinuation of pitocin, phenylephrine x2, IVF, and maternal repositioning to hands and knees. S/p terbutaline x1, improvement in FHT to 120s but then given recurrent fetal bradycardia to 40s despite a second dose of terbutaline, decision was made to call STAT Cesarean given remote from delivery (cervix non-reducible at 7-8cm with -1 station). Due to emergency situation, a full written consent process was not obtained. The risks, benefits, complications, treatment options, and expected outcomes were discussed with the patient while moving to the OR. The patient concurred with the proposed plan, giving verbal informed consent.     FINDINGS:  Viable female infant in cephalic presentation.  Apgars 1 and 6.  Clear amniotic fluid.  Intact placenta, three vessel cord.  Normal uterus, fallopian tubes and ovaries bilaterally.  ANESTHESIA: Epidural (converted to general anesthesia given inadequate epidural in s/o STAT Cesarean) INTRAVENOUS FLUIDS: 1900 ml   ESTIMATED BLOOD LOSS:  360 ml URINE OUTPUT:  200 ml SPECIMENS: Placenta sent to pathology COMPLICATIONS: Difficult intubation with anesthesia  PROCEDURE IN DETAIL:  The patient was urgently taken to the the operating room; her epidural anesthesia was dosed up to surgical level. She was then placed in a dorsal supine position with a leftward tilt, prepped  quickly with betadine and draped in a sterile manner.  She already had a foley catheter in her bladder from L&D. Given that her epidural was found to NOT be adequate, she was transitioned to general anesthesia. After a timeout was performed, a Pfannenstiel skin incision was made with scalpel and carried through to the underlying layer of fascia. The fascial incision was extended bilaterally in a blunt fashion.  The fascia was separated from underlying rectus muscles bluntly.  The rectus muscles were separated in the midline bluntly and the peritoneum was entered bluntly. Attention was turned to the lower uterine segment where a low transverse hysterotomy was made with a scalpel and extended bilaterally bluntly.  The infant was successfully delivered, the cord was immediately clamped and cut and the infant was handed over to awaiting neonatology team. Incision to delivery time was less than two minutes.  Arterial cord gas was collected (pH 7.0). Cord blood was also collected. The placenta was manually extracted and delivered intact with a three-vessel cord. The uterus was then cleared of clot and debris.  The hysterotomy was closed with 0 Chromic in a running locked fashion, and an imbricating layer was also placed with 0 Chromic. Several figure of eight sutures were placed along the right lower uterine segment with good hemostasis noted. Arista powder was also applied along the right lower uterine segment. The pelvis was irrigated and cleared of all clot and debris. Hemostasis was confirmed on all surfaces. The peritoneum was closed with 2-0 Vicryl. The fascia was then closed using 0 Vicryl in a running fashion.  The subcutaneous layer was irrigated and the skin was closed with a 4-0 Vicryl subcuticular stitch. The patient  tolerated the procedure well. Sponge, instrument and needle counts were correct x 3.  She was taken to the recovery room in stable condition.   Sheila Oats, MD OB Fellow, Faculty  Practice 05/11/2020 5:03 AM

## 2020-05-11 NOTE — Discharge Summary (Signed)
Postpartum Discharge Summary  Date of Service updated 05/14/2020     Patient Name: Jennifer Ross DOB: 07-06-1986 MRN: [id number]  Date of admission: 05/10/2020 Delivery date:05/11/2020  Delivering provider: Chancy Milroy  Date of discharge:  05/14/2020 Admitting diagnosis: Term pregnancy [Z34.90] Intrauterine pregnancy: [redacted]w[redacted]d    Secondary diagnosis:  Principal Problem:   Cesarean delivery delivered Active Problems:   Supervision of other normal pregnancy, antepartum   History of low transverse cesarean section   Request for sterilization   Fractures involving multiple body regions   Thalassemia alpha carrier   AMA (advanced maternal age) multigravida 35+   Maternal iron deficiency anemia complicating pregnancy in third trimester   Carotid artery dissection (HCC)   Atlantoaxial dislocation   GBS (group B Streptococcus carrier), +RV culture, currently pregnant   Encounter for induction of labor   Decreased fetal movement affecting management of pregnancy in third trimester, fetus 1   No leakage of amniotic fluid into vagina   Vaginal bleeding in pregnancy, third trimester  Additional problems: as noted above  Discharge diagnosis: STAT Cesarean Delivery Delivered                                           Post partum procedures:CT scan cervical spine Augmentation: Pitocin Complications: None  Hospital course: Induction of Labor With Cesarean Section   34y.o. yo [id number] admitted to the hospital 05/10/2020 for induction of labor secondary to decreased fetal movement. Reassuringly, FHT was reactive on arrival to L&D. Patient had a labor course significant for NRFHTs remote from delivery. Specifically, shortly after SROM for clear fluid, fetal bradycardia to 40s. FHT improved to 90s s/p discontinuation of pitocin, phenylephrine x2, IVF, and maternal repositioning to hands and knees. S/p terbutaline x1, improvement in FHT to 120s but then given recurrent fetal  bradycardia to 40s despite a second dose of terbutaline, decision was made to call STAT Cesarean given remote from delivery (cervix non-reducible at 7-8cm with -1 station). The patient went for STAT repeat cesarean section due to NRFHTs as noted above. Delivery details are as follows: Membrane Rupture Time/Date: 12:35 AM ,05/11/2020   Delivery Method:C-Section, Low Transverse  Details of operation can be found in separate operative Note.  Patient had an uncomplicated postpartum course. She is ambulating, tolerating a regular diet, passing flatus, and urinating well.  Patient is discharged home in stable condition on 05/14/20.     She complained of neck pain and dizziness after surgery that gradually improved but required meclizine and a cervical spine CT to evaluate and treat. She had stridor for 24-36 hrs due to difficult intubation Newborn Data: Birth date:05/11/2020  Birth time:1:45 AM  Gender:Female  Living status:Living  Apgars:1 ,6  Weight:3595 g                                 Magnesium Sulfate received: No BMZ received: No Rhophylac:N/A MMR:N/A T-DaP:Given prenatally Flu: Yes Transfusion:No  Physical exam  Vitals:   05/13/20 2012 05/13/20 2344 05/14/20 0410 05/14/20 0755  BP: 130/71 129/72 123/68 125/71  Pulse: 95 81 74 73  Resp: _0 Temp: 98.4 F (36.9 C)  98.3 F (36.8 C) 98.4 F (36.9 C)  TempSrc: Oral  Oral Oral  SpO2:   93% 93%  General: alert, cooperative and no distress Lochia: appropriate Uterine Fundus: firm Incision: Dressing is clean, dry, and intact DVT Evaluation: No evidence of DVT seen on physical exam. Labs: Lab Results  Component Value Date   WBC 10.0 05/12/2020   HGB 8.1 (L) 05/12/2020   HCT 26.1 (L) 05/12/2020   MCV 74.8 (L) 05/12/2020   PLT 153 05/12/2020   CMP Latest Ref Rng & Units 10/24/2019  Glucose 65 - 99 mg/dL 83  BUN 6 - 20 mg/dL 11  Creatinine 0.57 - 1.00 mg/dL 0.83  Sodium 134 - 144 mmol/L 136  Potassium 3.5 - 5.2  mmol/L 4.1  Chloride 96 - 106 mmol/L 101  CO2 20 - 29 mmol/L 21  Calcium 8.7 - 10.2 mg/dL 9.1  Total Protein 6.0 - 8.5 g/dL 7.1  Total Bilirubin 0.0 - 1.2 mg/dL 0.4  Alkaline Phos 39 - 117 IU/L 55  AST 0 - 40 IU/L 16  ALT 0 - 32 IU/L 12   Edinburgh Score: No flowsheet data found.   After visit meds:  Allergies as of 05/14/2020   No Known Allergies     Medication List    STOP taking these medications   Misc. Devices Misc     TAKE these medications   docusate sodium 100 MG capsule Commonly known as: COLACE Take 100 mg by mouth 2 (two) times daily as needed (constipation.).   ferrous sulfate 325 (65 FE) MG tablet Commonly known as: FerrouSul Take 1 tablet (325 mg total) by mouth daily with breakfast. What changed: when to take this   ibuprofen 800 MG tablet Commonly known as: ADVIL Take 1 tablet (800 mg total) by mouth every 8 (eight) hours.   oxyCODONE 5 MG immediate release tablet Commonly known as: Oxy IR/ROXICODONE Take 1 tablet (5 mg total) by mouth every 3 (three) hours as needed for moderate pain.   PrePLUS 27-1 MG Tabs Take 1 tablet by mouth daily.        Discharge home in stable condition Infant Feeding: Breast Infant Disposition:home with mother Discharge instruction: per After Visit Summary and Postpartum booklet. Activity: Advance as tolerated. Pelvic rest for 6 weeks.  Diet: routine diet Future Appointments: Future Appointments  Date Time Provider [place]  05/19/2020 11:00 AM [facility] NURSE [facility]  06/17/2020  1:15 PM Caren Macadam, MD [facility]   Follow up Visit: Message sent to [facility] on 05/11/20 to schedule PP appt & incision/mood check.  Please schedule this patient for a In person postpartum visit in 4 weeks with the following provider: Any provider. Additional Postpartum F/U:Incision check 1 week & Mood check 1 week High risk pregnancy complicated by: TOLAC, alpha thal carrier,  AMA, carotid artery dissection, GBS+ status, decreased fetal movement, pelvic fractures, anemia, h/o carotid artery dissection (traumatic) Delivery mode:  C-Section, Low Transverse  Anticipated Birth Control:  unsure--BTL papers signed but unable to obtain consent given STAT Cesarean  05/14/2020 Emeterio Reeve, MD

## 2020-05-11 NOTE — Consult Note (Signed)
Delivery Note:  C-section       05/11/2020  2:35 AM  I was called to the operating room at the request of the patient's obstetrician (Dr. Rande Lawman) for a stat repeat c-section.  PRENATAL HX:  This is a 34 y/o G4P2012 at 72 and 1/[redacted] weeks gestation who was admitted for IOL decreased fetal movement and vaginal bleeding.  Pregnancy complications include AMA, previous c-section, GBS positive (mother received adequate treatment), and silent alpha thalassemia carrier.  ROM x1 hour.  Delivery plan was initially TOLAC, but delivery ultimately by stat c-section for prolonged fetal heart rate deceleration.  Mother placed under general anesthesia for procedure due to inadequate pain control with epidural.    DELIVERY:  Infant had no respiratory effort and poor tone at delivery, so cord clamping was not delayed.  She did not respond to standard warming, drying and stimulation so mouth and nose suctioned and PPV initiated.  HR was ~60 bmp but quickly rose to >100 with PPV.  PPV given for 30 sections x2, then CPAP applied for 1 minute to achieve goal saturations.  O2 saturations in 90s in RA by 5 minutes of age.  Tone normalized by 10 minutes of age.  APGARs 1, 6 and 9.  Exam notable for shallow 1 cm laceration to left cheek.  After 15 minutes, baby left with nurse to assist father with skin-to-skin care.   _____________________ Electronically Signed By: Maryan Char, MD Neonatologist

## 2020-05-11 NOTE — Progress Notes (Signed)
Labor Progress Note Quinette Hentges is a 34 y.o. T0R0301 at [redacted]w[redacted]d presented for vaginal bleeding and DFM. S:   O:  BP 130/82   Pulse 81   Temp 98.1 F (36.7 C) (Oral)   Resp 18   SpO2 100% Comment: room air EFM: 130/mod/accels  CVE: Dilation: 3 Effacement (%): 50 Station: -3 Presentation: Vertex Exam by:: Germaine Pomfret, MD   A&P: 34 y.o. O9P6924 [redacted]w[redacted]d here for IOL and TOLAC. Plan to call Dr. Shawnie Pons for delivery #Labor: Progressing well. TOLAC FB in/out. Initial CVE was 1/30/-3 #Pain: Epidural  #FWB: Cat 1 #GBS positive PCN adequate #History of Pubic rami fractures (cleared with Dr. Shawnie Pons)  Jesusita Oka, MD 12:55 AM

## 2020-05-11 NOTE — Discharge Instructions (Signed)

## 2020-05-11 NOTE — Anesthesia Postprocedure Evaluation (Signed)
Anesthesia Post Note  Patient: Jennifer Ross  Procedure(s) Performed: AN AD HOC LINE PLACEMENT     Patient location during evaluation: Mother Baby Anesthesia Type: Epidural Level of consciousness: awake and alert Pain management: pain level controlled Vital Signs Assessment: post-procedure vital signs reviewed and stable Respiratory status: spontaneous breathing, nonlabored ventilation and respiratory function stable Cardiovascular status: stable Postop Assessment: no headache, no backache and epidural receding Anesthetic complications: no   No complications documented.  Last Vitals:  Vitals:   05/11/20 1540 05/11/20 1552  BP: (!) 134/112 116/64  Pulse: 70 90  Resp:    Temp:    SpO2:      Last Pain:  Vitals:   05/11/20 1304  TempSrc: Oral  PainSc:    Pain Goal:                   [employer]

## 2020-05-11 NOTE — Anesthesia Postprocedure Evaluation (Signed)
Anesthesia Post Note  Patient: Jennifer Ross  Procedure(s) Performed: CESAREAN SECTION WITH BILATERAL TUBAL LIGATION (Bilateral )     Patient location during evaluation: PACU Anesthesia Type: Epidural and General Level of consciousness: awake and alert, oriented and patient cooperative Pain management: pain level controlled Vital Signs Assessment: post-procedure vital signs reviewed and stable Respiratory status: spontaneous breathing, nonlabored ventilation and respiratory function stable Cardiovascular status: blood pressure returned to baseline and stable Postop Assessment: no apparent nausea or vomiting Anesthetic complications: no Comments: Insufficient time for epidural to set up (stat c section), conversion to general anesthesia. No issues   No complications documented.  Last Vitals:  Vitals:   05/11/20 0317 05/11/20 0326  BP: (!) 153/92   Pulse: (!) 111 (!) 104  Resp: 17 14  Temp:    SpO2: 100% 100%    Last Pain:  Vitals:   05/11/20 0058  TempSrc:   PainSc: 8    Pain Goal:                Epidural/Spinal Function Cutaneous sensation: Vague (05/11/20 0309), Patient able to flex knees: Yes (05/11/20 0309), Patient able to lift hips off bed: No (05/11/20 0309), Back pain beyond tenderness at insertion site: No (05/11/20 0309), Progressively worsening motor and/or sensory loss: No (05/11/20 0309), Bowel and/or bladder incontinence post epidural: No (05/11/20 0309)  Lannie Fields

## 2020-05-11 NOTE — Anesthesia Procedure Notes (Signed)
Performed by: Myrical Andujo N, CRNA       

## 2020-05-11 NOTE — Anesthesia Procedure Notes (Addendum)
Procedure Name: Intubation Date/Time: 05/11/2020 1:43 AM Performed by: Pervis Hocking, DO Pre-anesthesia Checklist: Patient identified, Emergency Drugs available, Suction available and Patient being monitored Patient Re-evaluated:Patient Re-evaluated prior to induction Oxygen Delivery Method: Circle system utilized Preoxygenation: Pre-oxygenation with 100% oxygen Induction Type: Rapid sequence and IV induction Laryngoscope Size: Glidescope, 3 and Mac Grade View: Grade III Tube size: 7.0 mm Number of attempts: 3 Airway Equipment and Method: Video-laryngoscopy and Oral airway Placement Confirmation: positive ETCO2,  CO2 detector and breath sounds checked- equal and bilateral Secured at: 23 cm Tube secured with: Tape Dental Injury: Teeth and Oropharynx as per pre-operative assessment  Difficulty Due To: Difficulty was anticipated, Difficult Airway- due to large tongue, Difficult Airway- due to anterior larynx and Difficult Airway-  due to edematous airway Comments: Glidescope x 2: anterior larynx, very large tongue- glidescope malfunction during usage. DL x 1 grade 3 view, ETT passed without direct visualization of cords due to emergent nature of situation.

## 2020-05-11 NOTE — Anesthesia Procedure Notes (Signed)
Epidural Patient location during procedure: OB Start time: 05/11/2020 12:01 AM End time: 05/11/2020 12:12 AM  Staffing Anesthesiologist: Lannie Fields, DO Performed: anesthesiologist   Preanesthetic Checklist Completed: patient identified, IV checked, risks and benefits discussed, monitors and equipment checked, pre-op evaluation and timeout performed  Epidural Patient position: sitting Prep: DuraPrep and site prepped and draped Patient monitoring: continuous pulse ox, blood pressure, heart rate and cardiac monitor Approach: midline Location: L3-L4 Injection technique: LOR air  Needle:  Needle type: Tuohy  Needle gauge: 17 G Needle length: 9 cm Needle insertion depth: 7 cm Catheter type: closed end flexible Catheter size: 19 Gauge Catheter at skin depth: 12 cm Test dose: negative  Assessment Sensory level: T8 Events: blood not aspirated, injection not painful, no injection resistance, no paresthesia and negative IV test  Additional Notes Patient identified. Risks/Benefits/Options discussed with patient including but not limited to bleeding, infection, nerve damage, paralysis, failed block, incomplete pain control, headache, blood pressure changes, nausea, vomiting, reactions to medication both or allergic, itching and postpartum back pain. Confirmed with bedside nurse the patient's most recent platelet count. Confirmed with patient that they are not currently taking any anticoagulation, have any bleeding history or any family history of bleeding disorders. Patient expressed understanding and wished to proceed. All questions were answered. Sterile technique was used throughout the entire procedure. Please see nursing notes for vital signs. Test dose was given through epidural catheter and negative prior to continuing to dose epidural or start infusion. Warning signs of high block given to the patient including shortness of breath, tingling/numbness in hands, complete motor  block, or any concerning symptoms with instructions to call for help. Patient was given instructions on fall risk and not to get out of bed. All questions and concerns addressed with instructions to call with any issues or inadequate analgesia.  Reason for block:procedure for pain

## 2020-05-11 NOTE — Lactation Note (Signed)
This note was copied from a baby's chart. Lactation Consultation Note Baby 21 hrs old. Mom's 3rd baby. Mom didn't BF her other 2 children. Mom hasn't felt well today so she wanted the baby to have formula.  Mom has good everted nipples. Hand expression taught w/colostrum noted. Breast large. Dry cloth placed under breast for support during feeding.  Mom stated baby has a really strong suck. Encouraged FOB to flange lips to widen latch and get deeper. Pain resolves after adjusted.  Discussed body alignment, positioning, support, props, STS, I&O, newborn feeding habits, behavior, breast massage, milk storage, supply and demand. Mom encouraged to feed baby 8-12 times/24 hours and with feeding cues. Mom encouraged to waken baby for feeds if baby hasn't cued in 3 hrs. A lot of teaching done during consult. FOB asked a lot of questions. Encouraged to BF before giving formula.   DEBP at bedside set up. Encouraged to pump Q3 hrs for stimulation. Mom sleepy. On O2. Not feeling well at this time. Mom holding baby STS d/t baby going to get bath soon.  Encouraged to call for questions or assistance. Lactation brochure given. Reported to RN.  Patient Name: Jennifer Ross [id number] Date: 05/11/2020 Reason for consult: Initial assessment;Term;1st time breastfeeding   Maternal Data Has patient been taught Hand Expression?: Yes Does the patient have breastfeeding experience prior to this delivery?: No  Feeding Feeding Type: Breast Fed  LATCH Score Latch: Grasps breast easily, tongue down, lips flanged, rhythmical sucking.  Audible Swallowing: A few with stimulation  Type of Nipple: Everted at rest and after stimulation  Comfort (Breast/Nipple): Soft / non-tender  Hold (Positioning): Full assist, staff holds infant at breast  LATCH Score: 7  Interventions Interventions: Breast feeding basics reviewed;Support pillows;Assisted with latch;Position options;Skin to skin;Breast  massage;Hand express;DEBP;Breast compression;Adjust position  Lactation Tools Discussed/Used Tools: Pump Breast pump type: Double-Electric Breast Pump WIC Program: Yes Pump Review: Setup, frequency, and cleaning;Milk Storage Initiated by:: RN Date initiated:: 05/11/20   Consult Status Consult Status: Follow-up Date: 05/12/20 Follow-up type: In-patient    Charyl Dancer 05/11/2020, 11:09 PM

## 2020-05-11 NOTE — Transfer of Care (Signed)
Immediate Anesthesia Transfer of Care Note  Patient: Jennifer Ross  Procedure(s) Performed: CESAREAN SECTION WITH BILATERAL TUBAL LIGATION (Bilateral )  Patient Location: PACU  Anesthesia Type:General  Level of Consciousness: awake, alert  and patient cooperative  Airway & Oxygen Therapy: Patient Spontanous Breathing and Patient connected to face mask oxygen  Post-op Assessment: Report given to RN and Post -op Vital signs reviewed and stable  Post vital signs: Reviewed and stable  Last Vitals:  Vitals Value Taken Time  BP 153/92 05/11/20 0315  Temp    Pulse 111 05/11/20 0317  Resp 17 05/11/20 0317  SpO2 100 % 05/11/20 0317  Vitals shown include unvalidated device data.  Last Pain:  Vitals:   05/11/20 0058  TempSrc:   PainSc: 8          Complications: No complications documented.

## 2020-05-12 LAB — CBC
HCT: 27.3 % — ABNORMAL LOW (ref 36.0–46.0)
HCT: 26.1 % — ABNORMAL LOW (ref 36.0–46.0)
Hemoglobin: 8.3 g/dL — ABNORMAL LOW (ref 12.0–15.0)
Hemoglobin: 8.1 g/dL — ABNORMAL LOW (ref 12.0–15.0)
MCH: 22.7 pg — ABNORMAL LOW (ref 26.0–34.0)
MCH: 23.2 pg — ABNORMAL LOW (ref 26.0–34.0)
MCHC: 30.4 g/dL (ref 30.0–36.0)
MCHC: 31 g/dL (ref 30.0–36.0)
MCV: 74.6 fL — ABNORMAL LOW (ref 80.0–100.0)
MCV: 74.8 fL — ABNORMAL LOW (ref 80.0–100.0)
Platelets: 148 10*3/uL — ABNORMAL LOW (ref 150–400)
Platelets: 153 10*3/uL (ref 150–400)
RBC: 3.66 MIL/uL — ABNORMAL LOW (ref 3.87–5.11)
RBC: 3.49 MIL/uL — ABNORMAL LOW (ref 3.87–5.11)
RDW: 15.9 % — ABNORMAL HIGH (ref 11.5–15.5)
RDW: 15.9 % — ABNORMAL HIGH (ref 11.5–15.5)
WBC: 10.7 10*3/uL — ABNORMAL HIGH (ref 4.0–10.5)
WBC: 10 10*3/uL (ref 4.0–10.5)
nRBC: 0 % (ref 0.0–0.2)
nRBC: 0 % (ref 0.0–0.2)

## 2020-05-12 LAB — SURGICAL PATHOLOGY

## 2020-05-12 MED ORDER — MECLIZINE HCL 25 MG PO TABS
25.0000 mg | ORAL_TABLET | Freq: Once | ORAL | Status: AC
  Administered 2020-05-12: 25 mg via ORAL
  Filled 2020-05-12: qty 1

## 2020-05-12 MED ORDER — FERROUS SULFATE 325 (65 FE) MG PO TABS
325.0000 mg | ORAL_TABLET | Freq: Every day | ORAL | Status: DC
  Administered 2020-05-12 – 2020-05-14 (×3): 325 mg via ORAL
  Filled 2020-05-12 (×3): qty 1

## 2020-05-12 MED ORDER — ACETAMINOPHEN 500 MG PO TABS
1000.0000 mg | ORAL_TABLET | Freq: Four times a day (QID) | ORAL | Status: DC | PRN
  Administered 2020-05-12 – 2020-05-13 (×4): 1000 mg via ORAL
  Filled 2020-05-12 (×4): qty 2

## 2020-05-12 MED ORDER — MECLIZINE HCL 25 MG PO TABS
25.0000 mg | ORAL_TABLET | Freq: Four times a day (QID) | ORAL | Status: DC | PRN
  Administered 2020-05-12: 25 mg via ORAL
  Filled 2020-05-12 (×3): qty 1

## 2020-05-12 NOTE — Progress Notes (Signed)
Subjective:incision pain, less congestion Postpartum Day 1: Cesarean Delivery Patient reports incisional pain and tolerating PO.    Objective: Vital signs in last 24 hours: Temp:  [98 F (36.7 C)-98.6 F (37 C)] 98.2 F (36.8 C) (10/26 0754) Pulse Rate:  [70-106] 84 (10/26 0754) Resp:  [9-16] 16 (10/26 0754) BP: (98-135)/(54-112) 118/54 (10/26 0754) SpO2:  [91 %-100 %] 98 % (10/26 0755)  Physical Exam:  General: alert, cooperative, fatigued and no distress Lochia: appropriate Uterine Fundus: firm Incision: no significant drainage DVT Evaluation: No evidence of DVT seen on physical exam.  Recent Labs    05/11/20 0850 05/12/20 0749  HGB 10.3* 8.3*  HCT 34.9* 27.3*    Assessment/Plan: Status post Cesarean section. Doing well postoperatively.  Continue current care. D/C O2 as sats are good.  Scheryl Darter 05/12/2020, 8:47 AM

## 2020-05-13 ENCOUNTER — Inpatient Hospital Stay (HOSPITAL_COMMUNITY): Payer: BC Managed Care – PPO

## 2020-05-13 ENCOUNTER — Inpatient Hospital Stay (HOSPITAL_COMMUNITY)
Admission: RE | Admit: 2020-05-13 | Payer: BC Managed Care – PPO | Source: Home / Self Care | Admitting: Obstetrics & Gynecology

## 2020-05-13 ENCOUNTER — Inpatient Hospital Stay (HOSPITAL_COMMUNITY)
Admission: AD | Admit: 2020-05-13 | Payer: BC Managed Care – PPO | Source: Home / Self Care | Admitting: Obstetrics and Gynecology

## 2020-05-13 MED ORDER — OXYCODONE HCL 5 MG PO TABS
10.0000 mg | ORAL_TABLET | ORAL | Status: DC | PRN
  Administered 2020-05-14 (×4): 10 mg via ORAL
  Filled 2020-05-13 (×4): qty 2

## 2020-05-13 MED ORDER — OXYCODONE HCL 5 MG PO TABS
5.0000 mg | ORAL_TABLET | ORAL | Status: DC | PRN
  Administered 2020-05-13 (×2): 5 mg via ORAL
  Filled 2020-05-13 (×3): qty 1

## 2020-05-13 NOTE — Progress Notes (Signed)
Postpartum Day 2: Cesarean Delivery Subjective: Patient reports excruciating neck pain, worsening since surgery. History of cervical spine surgery, she is concerned that something is wrong with her braces and neck, worried something was injured during her positioning for the urgent cesarean section.  Mild incisional pain. Tolerating oral intake, having flatus. Unable to get out of bed without assistance due to neck pain also feels "the room is spinning". Not helped by Meclizine which was ordered last night, no recent narcotics.  Baby doing well in NICU.    Objective: Vital signs in last 24 hours: Temp:  [98.1 F (36.7 C)-98.7 F (37.1 C)] 98.1 F (36.7 C) (10/27 0736) Pulse Rate:  [73-89] 73 (10/27 0736) Resp:  [15-18] 16 (10/27 0736) BP: (111-139)/(51-85) 139/58 (10/27 0736) SpO2:  [82 %-99 %] 98 % (10/27 0800)  Physical Exam:  General: keeping neck rigid due to pain Lochia: appropriate Uterine Fundus: firm Incision: no significant drainage, nontender DVT Evaluation: No evidence of DVT seen on physical exam.  Recent Labs    05/12/20 0749 05/12/20 1329  HGB 8.3* 8.1*  HCT 27.3* 26.1*    Assessment/Plan: Status post Cesarean section.  Talked to Radiologist, recommended ordering CT scan of cervical spine, no contrast. Will follow up results and manage accordingly. Encourage pulmonary toilet Continue Lovenox for VTE prophylaxis Analgesia as needed Continue close observation   Jaynie Collins, MD 05/13/2020, 9:13 AM

## 2020-05-13 NOTE — Progress Notes (Signed)
MD and RN at [facility] to assess pt status.  Pt is reporting 8/10 pain in neck and dizziness and vertigo, even at rest.  Pt unable to walk unassisted because "the room is spinning".    Pt remains on continuous pulse oximetry.  Is now on room air and able to maintain O2 status when awake, has periodic decreases into 80's while sleeping.  Will continue to monitor and encourage incentive spirometer.

## 2020-05-13 NOTE — Lactation Note (Signed)
This note was copied from a baby's chart. Lactation Consultation Note  Patient Name: Girl Aella Ronda [id number] Date: 05/13/2020 Reason for consult: Follow-up assessment;NICU baby   LC to room for f/u visit. Mom pumping during consult and experiencing +uterine contractions. Infant supplemented today because mom was concerned about her milk supply. Reviewed feeding norms on day 2-3 of life and encouraged continued HE/pumping. Baby to transfer to mother's room, per mom. Encouraged sts and feeding with cues at that time.    Interventions Interventions: Breast feeding basics reviewed;Skin to skin;Hand express;Expressed milk;DEBP    Consult Status Consult Status: PRN Date: 05/14/20 Follow-up type: In-patient    Elder Negus 05/13/2020, 11:25 AM

## 2020-05-13 NOTE — Progress Notes (Signed)
Pt to CT per transport.

## 2020-05-13 NOTE — Progress Notes (Signed)
Pt continues to experience vertigo.  Anesthesiologist contacted to evaluate patient for possible general anesthesia complications.

## 2020-05-14 ENCOUNTER — Telehealth: Payer: Medicaid Other | Admitting: Family Medicine

## 2020-05-14 MED ORDER — IBUPROFEN 800 MG PO TABS: 800.0000 mg | ORAL_TABLET | Freq: Three times a day (TID) | ORAL | 0 refills | Status: AC

## 2020-05-14 MED ORDER — ACETAMINOPHEN 500 MG PO TABS: 1000.0000 mg | ORAL_TABLET | Freq: Four times a day (QID) | ORAL | Status: DC | PRN

## 2020-05-14 MED ORDER — OXYCODONE HCL 5 MG PO TABS
5.0000 mg | ORAL_TABLET | ORAL | 0 refills | Status: AC | PRN
Start: 2020-05-14 — End: ?

## 2020-05-14 MED ORDER — IBUPROFEN 800 MG PO TABS
800.0000 mg | ORAL_TABLET | Freq: Three times a day (TID) | ORAL | Status: DC | PRN
  Administered 2020-05-14: 800 mg via ORAL
  Filled 2020-05-14: qty 1

## 2020-05-14 NOTE — Lactation Note (Signed)
This note was copied from a baby's chart. Lactation Consultation Note  Patient Name: Jennifer Ross [id number] Date: 05/14/2020 Reason for consult: Follow-up assessment;1st time breastfeeding;Term  LC in to visit with P3 Mom of term baby.  Mom did NOT breastfeed her first 2 babies.  Baby 80 hrs old and at 4% weight loss.  Mom asleep with swaddled baby in her arms.  LC removed baby to crib.  Mom woke up.  Talked about importance of baby sleeping on her back in her crib if Mom becomes sleepy.   Mom hasn't been putting baby to the breast exclusively, but would like to.  Offered to assist but Mom states she has too much pain with her uterine cramping when she does.  Left breast heavier than right because baby hasn't been feeding on that breast.  Mom states baby latches to the right side only.    Mom requested pain medication.  Both breasts are compressible, heavy with erect nipples.  Reviewed breast massage and hand expression for easy flow of transitional milk from right breast.    Mom educated that if she gives baby formula, she should pump both breasts for 15-20 mins.  Mom states she has a [employer] DEBP at home.  Assisted Mom with double pumping on initiation setting.  24 mm flanges a good fit.   RN aware that Mom would like assistance with breastfeeding.  RN to call LC prn.  Interventions Interventions: Breast feeding basics reviewed;Skin to skin;Breast massage;Hand express;DEBP;Hand pump  Lactation Tools Discussed/Used Tools: Pump;Bottle Breast pump type: Double-Electric Breast Pump   Consult Status Consult Status: Follow-up Follow-up type: In-patient    Judee Clara 05/14/2020, 9:58 AM

## 2020-05-18 ENCOUNTER — Ambulatory Visit (INDEPENDENT_AMBULATORY_CARE_PROVIDER_SITE_OTHER): Payer: BC Managed Care – PPO | Admitting: *Deleted

## 2020-05-18 ENCOUNTER — Other Ambulatory Visit: Payer: Self-pay

## 2020-05-18 NOTE — Progress Notes (Signed)
Pt here today for incision check status post cesarean section on 05/11/2020.   Incision healing well, no redness or drainage noted. Steri strips still intact.   Wound care instructions given.   Pt to follow up as needed and or at postpartum visit.

## 2020-05-19 ENCOUNTER — Ambulatory Visit: Payer: BC Managed Care – PPO

## 2020-05-19 NOTE — Progress Notes (Signed)
Patient was assessed and managed by nursing staff during this encounter. I have reviewed the chart and agree with the documentation and plan. I have also made any necessary editorial changes.  Jaynie Collins, MD 05/19/2020 9:55 AM

## 2020-05-26 ENCOUNTER — Telehealth: Payer: Self-pay | Admitting: Licensed Clinical Social Worker

## 2020-05-26 NOTE — Telephone Encounter (Signed)
-----   Message from Kathreen Cosier, [place] sent at 05/12/2020 11:05 AM EDT ----- Regarding: Needs PP mood check 05/25/20 Patient delivered 05/11/20 and needs postpartum mood check week of 05/25/20.

## 2020-06-01 ENCOUNTER — Ambulatory Visit (INDEPENDENT_AMBULATORY_CARE_PROVIDER_SITE_OTHER): Payer: BC Managed Care – PPO | Admitting: *Deleted

## 2020-06-01 ENCOUNTER — Other Ambulatory Visit: Payer: Self-pay

## 2020-06-01 NOTE — Progress Notes (Signed)
Subjective:  Jennifer Ross is a 34 y.o. female here for BP check.   Hypertension ROS: no TIA's, no chest pain on exertion, no dyspnea on exertion and no swelling of ankles.    Objective:  BP 130/84   Pulse 72   Appearance alert, well appearing, and in no distress. General exam BP noted to be well controlled today in office.    Assessment:   Blood Pressure stable.   Plan:  Follow up at postpartum visit and  or as needed.

## 2020-06-17 ENCOUNTER — Ambulatory Visit (INDEPENDENT_AMBULATORY_CARE_PROVIDER_SITE_OTHER): Payer: BC Managed Care – PPO | Admitting: Family Medicine

## 2020-06-17 ENCOUNTER — Other Ambulatory Visit (HOSPITAL_COMMUNITY)
Admission: RE | Admit: 2020-06-17 | Discharge: 2020-06-17 | Disposition: A | Discharge status: Home / Self Care | Payer: BC Managed Care – PPO | Source: Ambulatory Visit | Attending: Family Medicine | Admitting: Family Medicine

## 2020-06-17 ENCOUNTER — Encounter: Payer: Self-pay | Admitting: Family Medicine

## 2020-06-17 ENCOUNTER — Other Ambulatory Visit: Payer: Self-pay

## 2020-06-17 DIAGNOSIS — N898 Other specified noninflammatory disorders of vagina: Secondary | ICD-10-CM | POA: Insufficient documentation

## 2020-06-17 DIAGNOSIS — B9689 Other specified bacterial agents as the cause of diseases classified elsewhere: Secondary | ICD-10-CM | POA: Insufficient documentation

## 2020-06-17 DIAGNOSIS — O8613 Vaginitis following delivery: Secondary | ICD-10-CM | POA: Insufficient documentation

## 2020-06-17 DIAGNOSIS — O9883 Other maternal infectious and parasitic diseases complicating the puerperium: Secondary | ICD-10-CM | POA: Insufficient documentation

## 2020-06-17 DIAGNOSIS — B373 Candidiasis of vulva and vagina: Secondary | ICD-10-CM

## 2020-06-17 DIAGNOSIS — N760 Acute vaginitis: Secondary | ICD-10-CM

## 2020-06-17 DIAGNOSIS — Z98891 History of uterine scar from previous surgery: Secondary | ICD-10-CM

## 2020-06-17 MED ORDER — MEDROXYPROGESTERONE ACETATE 150 MG/ML IM SUSP: 150.0000 mg | INTRAMUSCULAR | 3 refills | Status: AC

## 2020-06-17 NOTE — Progress Notes (Signed)
Post Partum Visit Note  Jennifer Ross is a 34 y.o. [phone] female who presents for a postpartum visit. She is 6 weeks postpartum following a stat repeat low transverse cesarean section.  I have fully reviewed the prenatal and intrapartum course. The delivery was at [redacted]w[redacted]d gestational weeks.  Anesthesia: reported difficult intubation. Postpartum course has been uncomplicated. Baby is doing well. Baby is feeding by breast. Bleeding no bleeding. Bowel function is normal. Bladder function is normal. Patient is not sexually active. Contraception method is none. Postpartum depression screening: negative.   The pregnancy intention screening data noted above was reviewed. Potential methods of contraception were discussed. The patient elected to proceed with Abstinence.    Edinburgh Postnatal Depression Scale - 06/17/20 1329      Edinburgh Postnatal Depression Scale:  In the Past 7 Days   I have been able to laugh and see the funny side of things. 0    I have looked forward with enjoyment to things. 0    I have blamed myself unnecessarily when things went wrong. 0    I have been anxious or worried for no good reason. 0    I have felt scared or panicky for no good reason. 0    Things have been getting on top of me. 0    I have been so unhappy that I have had difficulty sleeping. 0    I have felt sad or miserable. 0    I have been so unhappy that I have been crying. 0    The thought of harming myself has occurred to me. 0    Edinburgh Postnatal Depression Scale Total 0            The following portions of the patient's history were reviewed and updated as appropriate: allergies, current medications, past family history, past medical history, past social history, past surgical history and problem list.  Review of Systems Pertinent items are noted in HPI.    Objective:  BP 128/82   Pulse 75   Wt 178 lb 3.2 oz (80.8 kg)   Breastfeeding Yes   BMI 32.59 kg/m    General:  alert,  cooperative and appears stated age   Breasts:  inspection negative, no nipple discharge or bleeding, no masses or nodularity palpable  Lungs: Normal WOB  Heart:  regular rate and rhythm, S1, S2 normal, no murmur, click, rub or gallop  Abdomen: soft, non-tender; bowel sounds normal; no masses,  no organomegaly   Vulva:  not evaluated  Vagina: not evaluated  Cervix:  not evaluated  Corpus: not examined  Adnexa:  not evaluated  Rectal Exam: Not performed.        Assessment:    Normal postpartum exam. Pap smear not done at today's visit.   Plan:   Essential components of care per ACOG recommendations:  1.  Mood and well being: Patient with negative depression screening today. Reviewed local resources for support.  - Patient does not use tobacco.  - hx of drug use? No    2. Infant care and feeding:  -Patient currently breastmilk feeding? Yes If breastmilk feeding discussed return to work and pumping. If needed, patient was provided letter for work to allow for every 2-3 hr pumping breaks, and to be granted a private location to express breastmilk and refrigerated area to store breastmilk. Reviewed importance of draining breast regularly to support lactation. - Discussed extensively the role of breast evacuation to increase milk supply. She is currently feeding  unilaterally only on Right. Encouraged 5-10 minutes of hand expression and coached on this today in clinic. Discussed pumping left breast to help stimulate increased milk. 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 Left is about 1 oz.  -Social determinants of health (SDOH) reviewed in EPIC. No concerns  3. Sexuality, contraception and birth spacing - Patient does not want a pregnancy in the next year.  Desired family size is 3 children.  - Reviewed forms of contraception in tiered fashion. Patient desired Depo-Provera - she will get first shot in 2-3 weeks and then  was prescrived 1 year of refills. Discussed potential to decrease milk supply but also reviewed that timing of returning to work may also reduce supply.  - Discussed birth spacing of 18 months  4. Sleep and fatigue -Encouraged family/partner/community support of 4 hrs of uninterrupted sleep to help with mood and fatigue  5. Physical Recovery  - Discussed patients delivery- had stat CS - Patient has urinary incontinence? No - Patient was referred to pelvic floor PT  - Patient is not safe to resume physical and sexual activity  6.  Health Maintenance - Last pap smear done 2021 and was normal with negative HPV. Mammogram  7. Chronic Disease - PCP follow up - vaginal discharge-- will self collect. Plan to treat per SO if needed  Federico Flake, MD [facility], [facility]

## 2020-06-18 ENCOUNTER — Telehealth: Payer: Self-pay

## 2020-06-18 LAB — CERVICOVAGINAL ANCILLARY ONLY
Bacterial Vaginitis (gardnerella): POSITIVE — AB
Candida Glabrata: POSITIVE — AB
Candida Vaginitis: NEGATIVE
Chlamydia: NEGATIVE
Comment: NEGATIVE
Comment: NEGATIVE
Comment: NEGATIVE
Comment: NEGATIVE
Comment: NEGATIVE
Comment: NORMAL
Neisseria Gonorrhea: NEGATIVE
Trichomonas: NEGATIVE

## 2020-06-18 NOTE — Telephone Encounter (Signed)
Pt advised of results, pt verbalized understanding.

## 2020-06-18 NOTE — Telephone Encounter (Signed)
-----   Message from Federico Flake, MD sent at 06/18/2020 11:45 AM EST ----- Positive for Candida and BV. Please send in treatment per standing to her pharmacy of choice.

## 2020-06-22 MED ORDER — BREAST PUMP MISC: 0 refills | Status: DC

## 2020-06-29 ENCOUNTER — Other Ambulatory Visit: Payer: Self-pay | Admitting: *Deleted

## 2020-06-29 MED ORDER — BREAST PUMP MISC: 0 refills | Status: AC

## 2020-07-01 ENCOUNTER — Ambulatory Visit: Payer: BC Managed Care – PPO

## 2020-07-01 ENCOUNTER — Encounter: Payer: Self-pay | Admitting: *Deleted

## 2020-07-02 ENCOUNTER — Other Ambulatory Visit: Payer: Self-pay

## 2020-07-02 ENCOUNTER — Ambulatory Visit: Payer: BC Managed Care – PPO

## 2020-07-27 ENCOUNTER — Encounter: Payer: Self-pay | Admitting: Family Medicine

## 2020-09-21 ENCOUNTER — Ambulatory Visit: Payer: BC Managed Care – PPO

## 2020-11-17 ENCOUNTER — Telehealth: Payer: Self-pay | Admitting: Licensed Clinical Social Worker

## 2020-11-17 NOTE — Telephone Encounter (Signed)
Patient left vm requesting a call back.  ?

## 2020-11-30 ENCOUNTER — Other Ambulatory Visit: Payer: Self-pay | Admitting: *Deleted

## 2020-11-30 MED ORDER — METRONIDAZOLE 0.75 % VA GEL: 1.0000 | Freq: Every day | VAGINAL | 1 refills | Status: DC

## 2020-12-04 IMAGING — US US OB < 14 WEEKS - US OB TV
1 series · 14 of 28 positions shown · non-contrast
Comparison: 06/27/2019

CLINICAL DATA: Viability scan

EXAM:
OBSTETRIC <14 WK US AND TRANSVAGINAL OB US
TECHNIQUE: Both transabdominal and transvaginal ultrasound examinations were
performed for complete evaluation of the gestation as well as the
maternal uterus, adnexal regions, and pelvic cul-de-sac.
Transvaginal technique was performed to assess early pregnancy.

[Series 1: us ob < 14 weeks - us ob tv · 0.15mm/px · 14 of 91 slices shown]
[im 4/91]
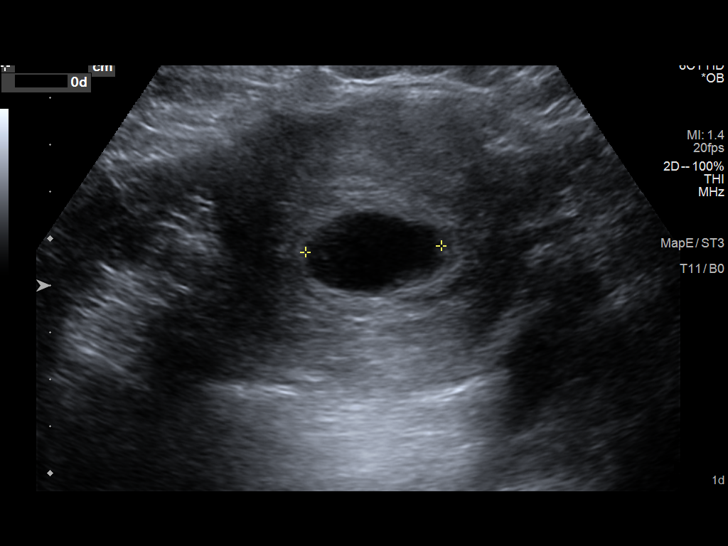
[im 11/91]
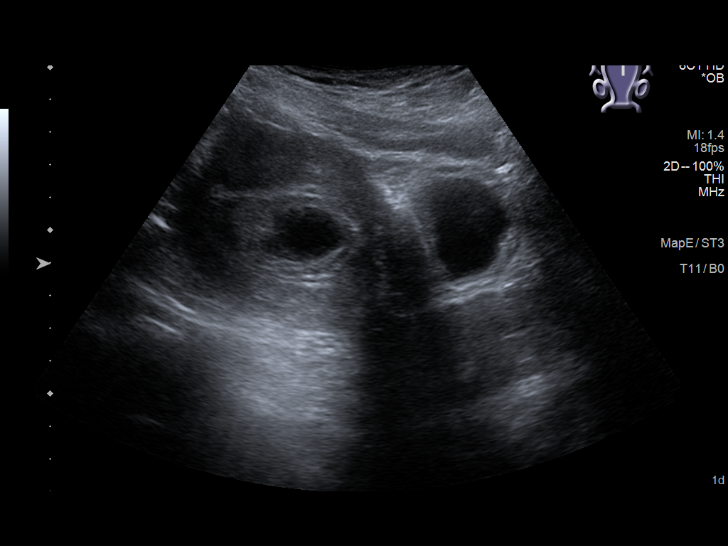
[im 17/91]
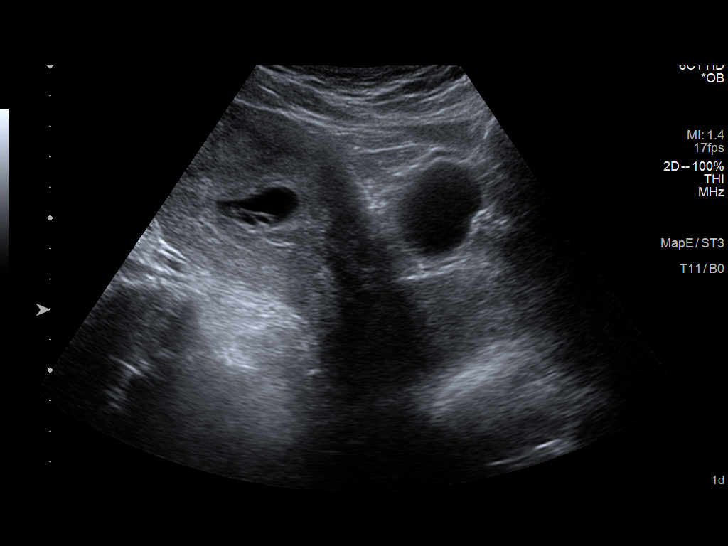
[im 24/91]
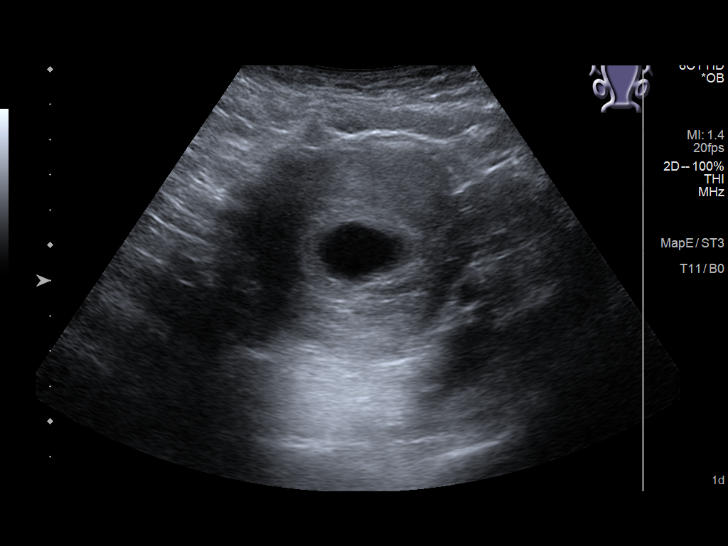
[im 31/91]
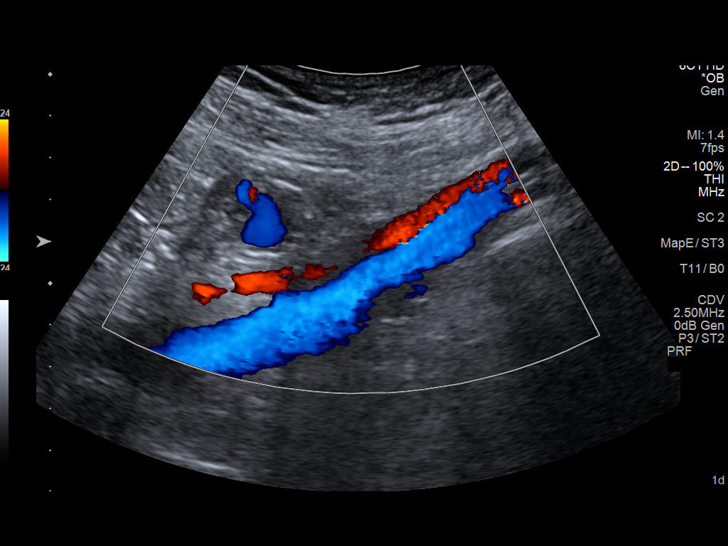
[im 37/91]
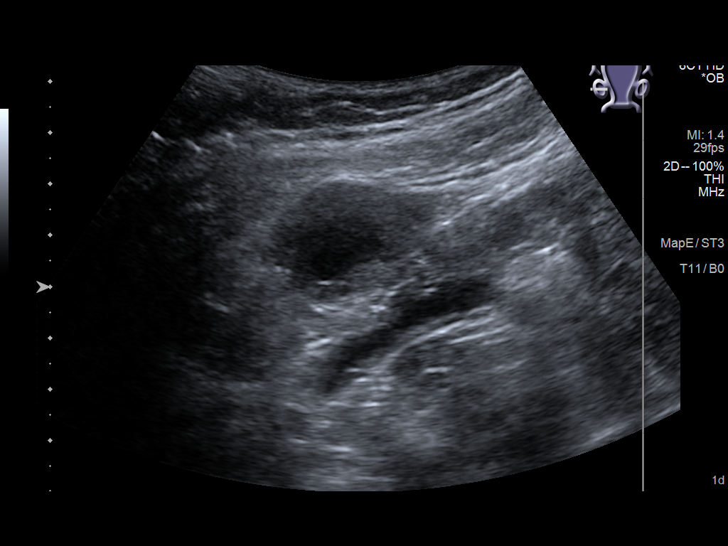
[im 44/91]
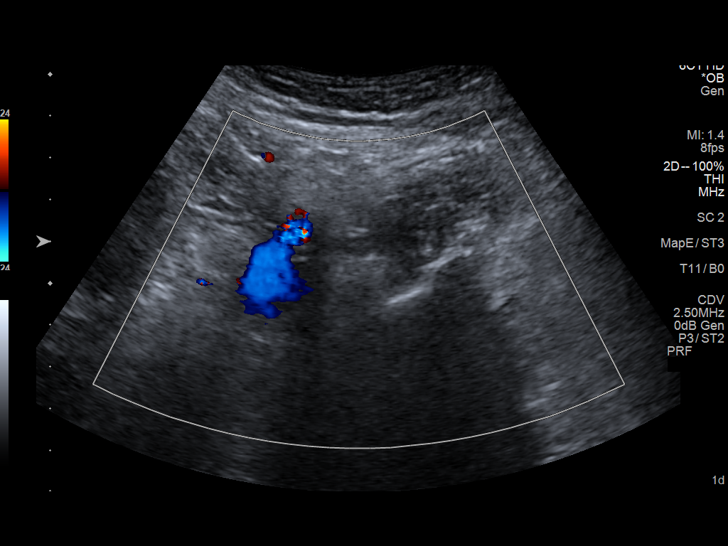
[im 51/91]
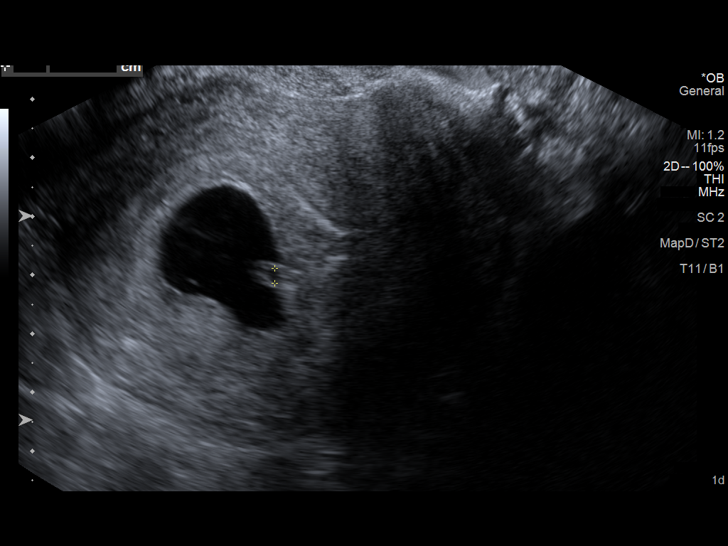
[im 57/91]
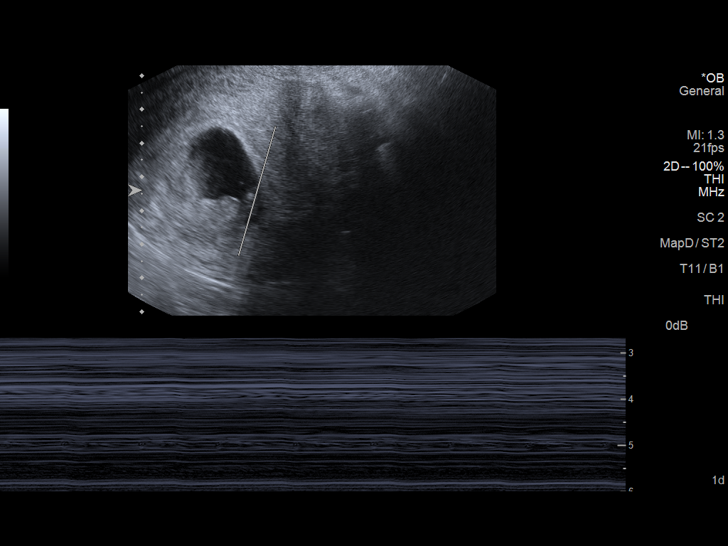
[im 64/91]
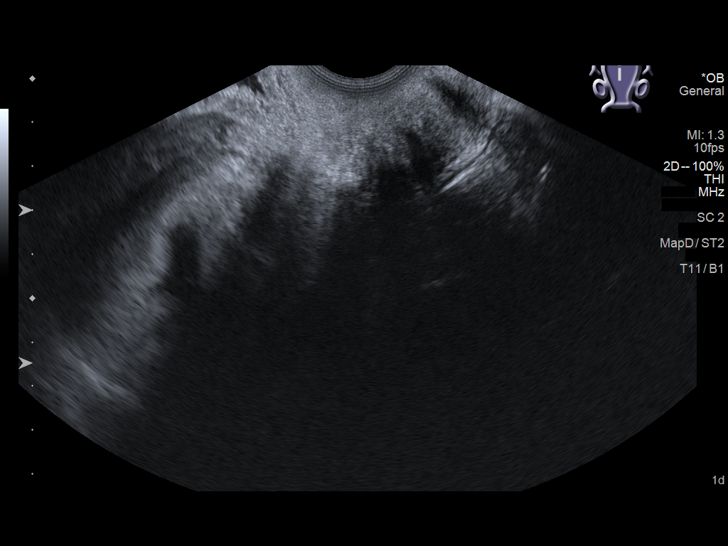
[im 71/91]
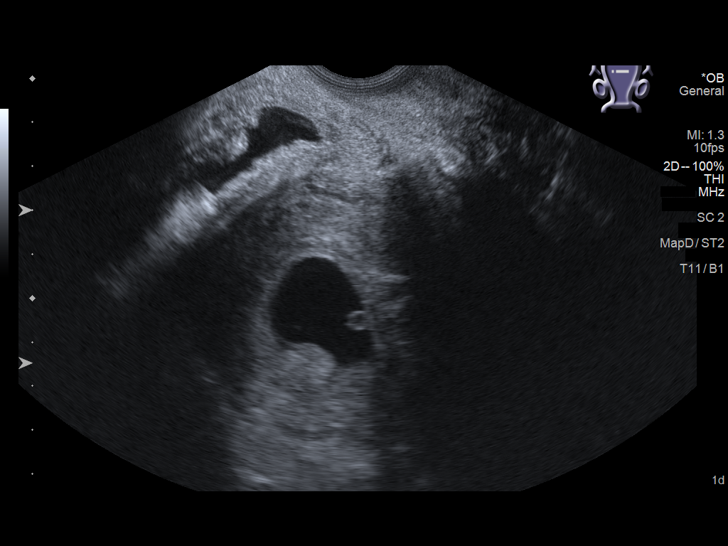
[im 77/91]
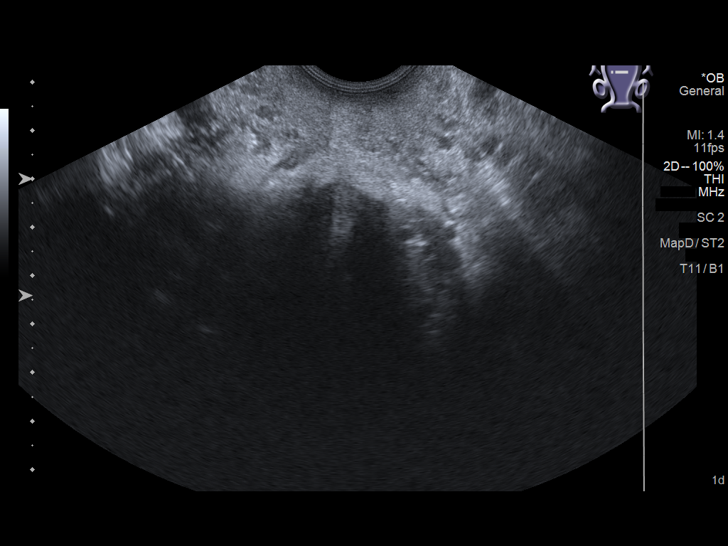
[im 84/91]
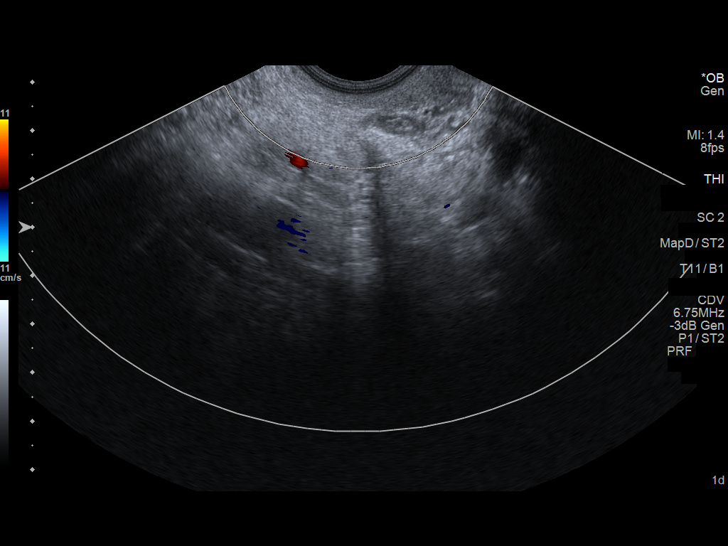
[im 91/91]
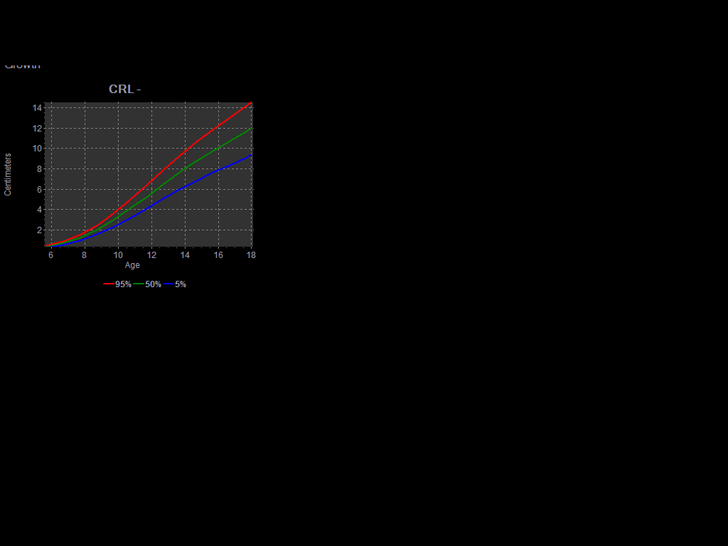

[14 of 28 positions shown; findings below may reference images not displayed]

FINDINGS: Intrauterine gestational sac: Single

Yolk sac:  Visualized.

Embryo:  Visualized.

Cardiac Activity: Visualized.

Heart Rate: 131 bpm

CRL: 7.6 mm   6 w   5 d                  US EDC: 05/17/2020

Subchorionic hemorrhage:  None visualized.

Maternal uterus/adnexae: Ovaries are within normal limits. The left
ovary measures 3.4 x 2.5 x 2.2 cm. The right ovary measures 2.7 x 2
x 2.3 cm. No significant free fluid.
IMPRESSION: Single viable intrauterine pregnancy with estimated sonographic age
of 6 weeks 5 days and EDC of 05/17/2020. No other specific
abnormalities are seen.

## 2021-01-25 ENCOUNTER — Ambulatory Visit (INDEPENDENT_AMBULATORY_CARE_PROVIDER_SITE_OTHER): Payer: BC Managed Care – PPO

## 2021-01-25 ENCOUNTER — Other Ambulatory Visit (HOSPITAL_COMMUNITY)
Admission: RE | Admit: 2021-01-25 | Discharge: 2021-01-25 | Disposition: A | Discharge status: Home / Self Care | Payer: BC Managed Care – PPO | Source: Ambulatory Visit | Attending: Obstetrics and Gynecology | Admitting: Obstetrics and Gynecology

## 2021-01-25 ENCOUNTER — Other Ambulatory Visit: Payer: Self-pay

## 2021-01-25 VITALS — BP 115/72 | HR 80

## 2021-01-25 DIAGNOSIS — N898 Other specified noninflammatory disorders of vagina: Secondary | ICD-10-CM | POA: Diagnosis present

## 2021-01-25 DIAGNOSIS — Z113 Encounter for screening for infections with a predominantly sexual mode of transmission: Secondary | ICD-10-CM

## 2021-01-25 NOTE — Progress Notes (Addendum)
SUBJECTIVE:  35 y.o. female complains of green vaginal discharge for x3 day(s). Denies abnormal vaginal bleeding or significant pelvic pain or fever. No UTI symptoms. Denies history of known exposure to STD.  No LMP recorded.  OBJECTIVE:  She appears well, afebrile. Urine dipstick: not done.  ASSESSMENT:  Vaginal Discharge  Screen for STI's    PLAN:  GC, chlamydia, trichomonas, BVAG, CVAG probe sent to lab. Treatment: To be determined once lab results are received ROV prn if symptoms persist or worsen.

## 2021-01-25 NOTE — Progress Notes (Signed)
Pt is here in the office for discharge --yellow/green. Pt did self swab and sent to lab.

## 2021-01-26 LAB — CERVICOVAGINAL ANCILLARY ONLY
Bacterial Vaginitis (gardnerella): POSITIVE — AB
Candida Glabrata: NEGATIVE
Candida Vaginitis: NEGATIVE
Chlamydia: NEGATIVE
Comment: NEGATIVE
Comment: NEGATIVE
Comment: NEGATIVE
Comment: NEGATIVE
Comment: NEGATIVE
Comment: NORMAL
Neisseria Gonorrhea: NEGATIVE
Trichomonas: NEGATIVE

## 2021-01-27 NOTE — Progress Notes (Signed)
Patient was assessed and managed by nursing staff during this encounter. I have reviewed the chart and agree with the documentation and plan. I have also made any necessary editorial changes.  [place] Bing, MD 01/27/2021 8:28 AM

## 2021-02-02 ENCOUNTER — Other Ambulatory Visit: Payer: Self-pay | Admitting: *Deleted

## 2021-02-02 MED ORDER — METRONIDAZOLE 0.75 % VA GEL: 1.0000 | Freq: Every day | VAGINAL | 1 refills | Status: AC

## 2021-03-01 IMAGING — US US MFM OB DETAIL+14 WK
1 series · 13 of 28 positions shown · non-contrast
Comparison: none

[Series 1: us mfm ob detail+14 wk · 13 of 80 slices shown]
[im 3/80]
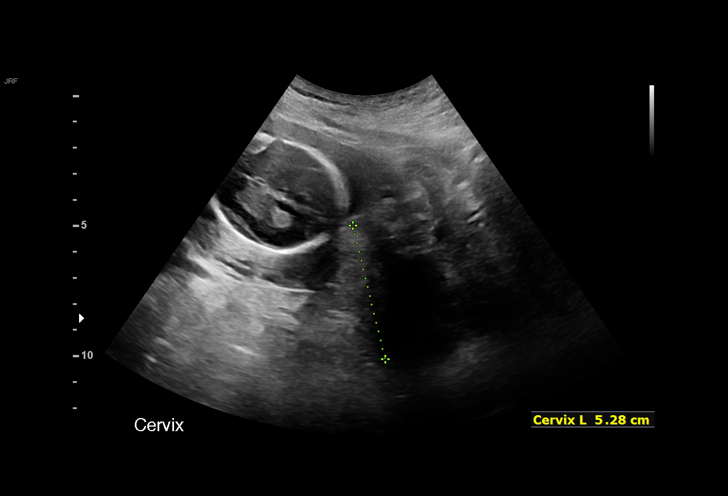
[im 9/80]
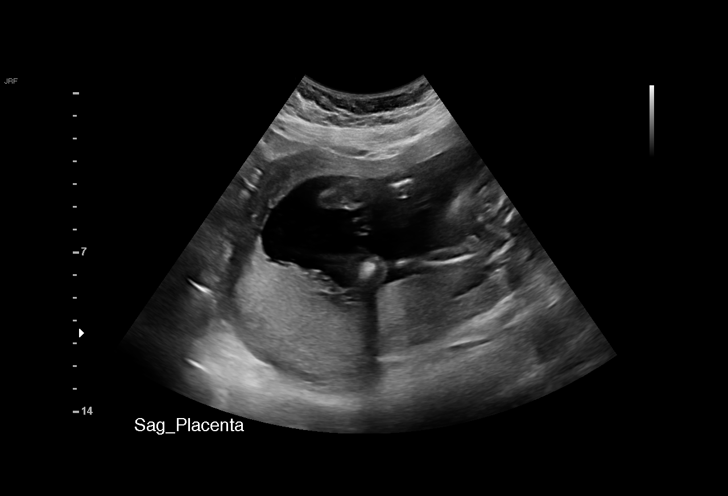
[im 15/80]
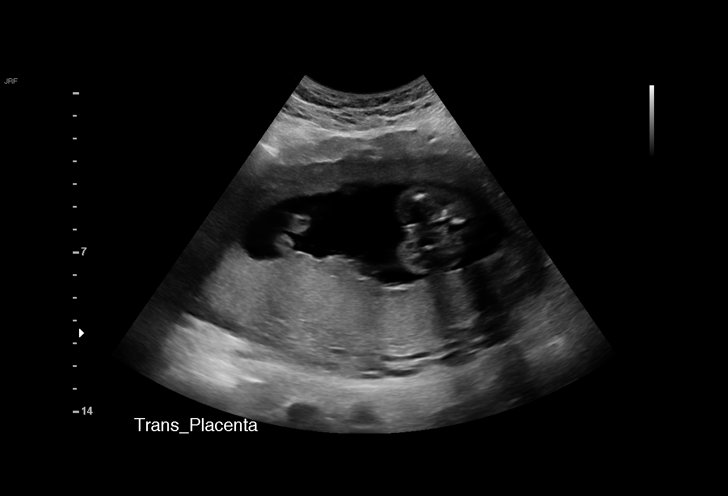
[im 21/80]
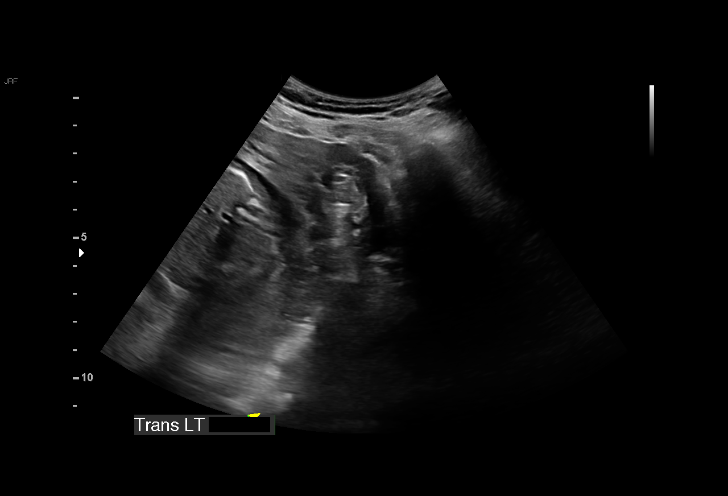
[im 27/80]
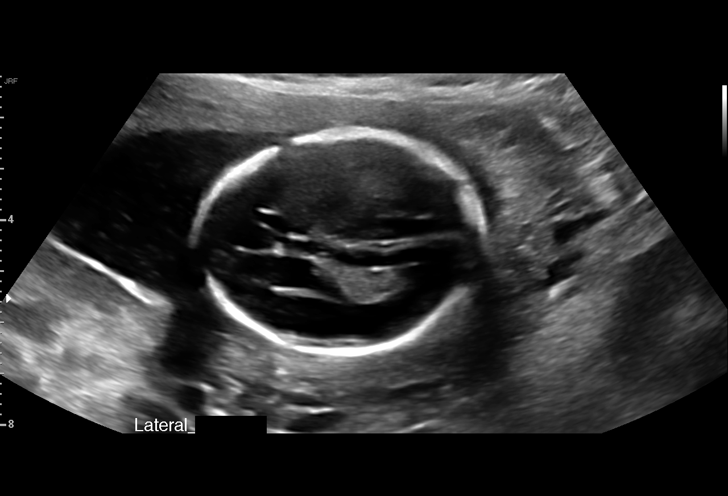
[im 33/80]
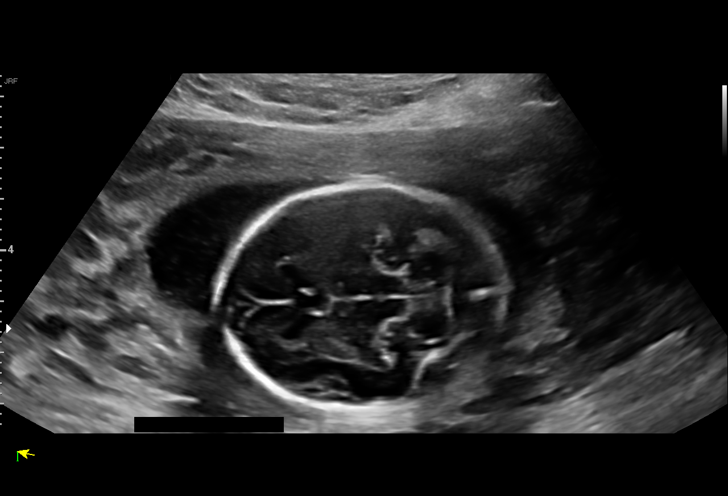
[im 41/80]
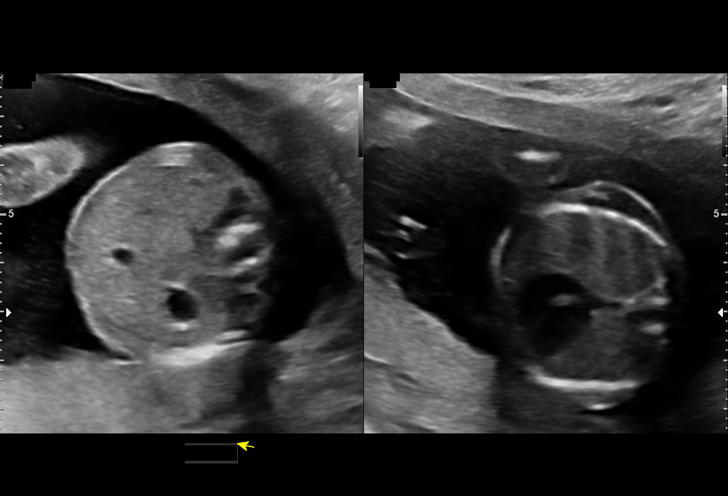
[im 47/80]
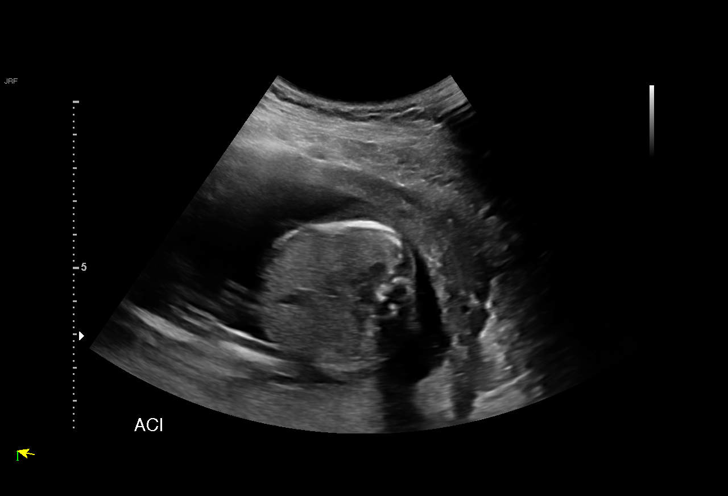
[im 53/80]
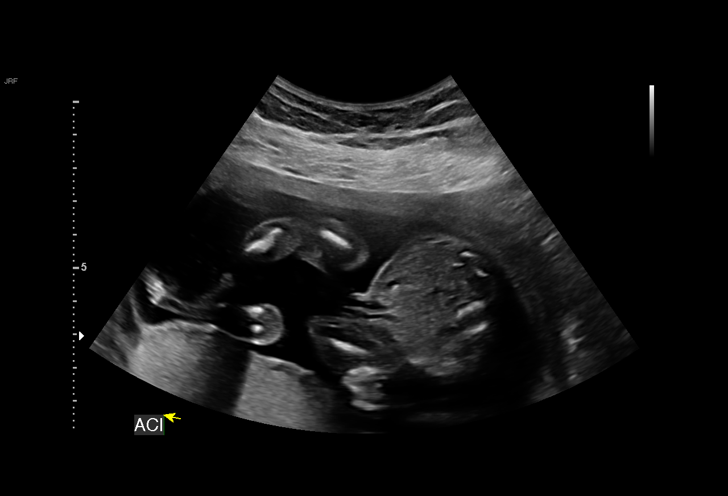
[im 59/80]
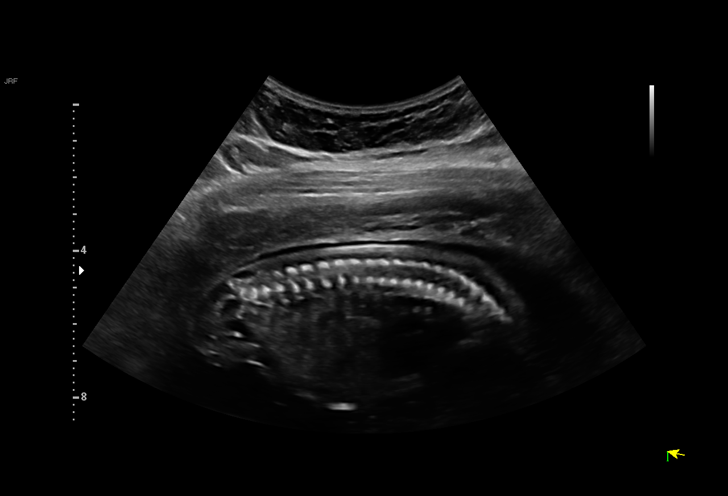
[im 65/80]
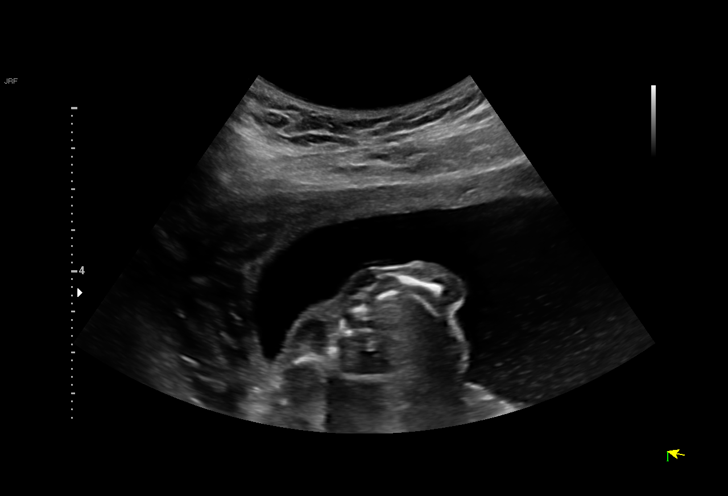
[im 71/80]
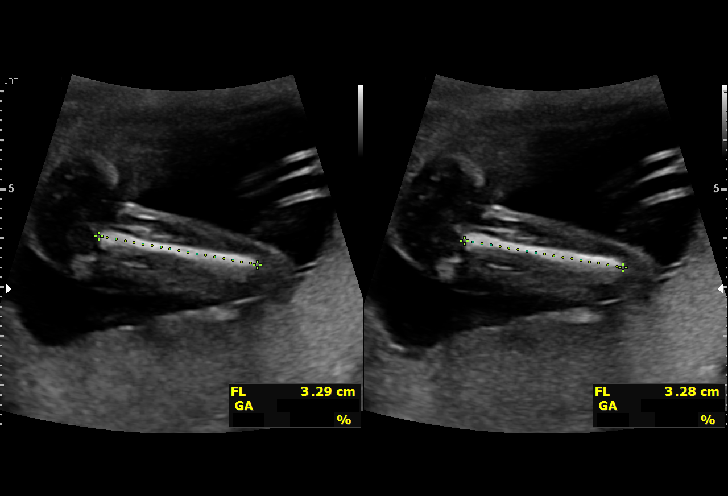
[im 77/80]
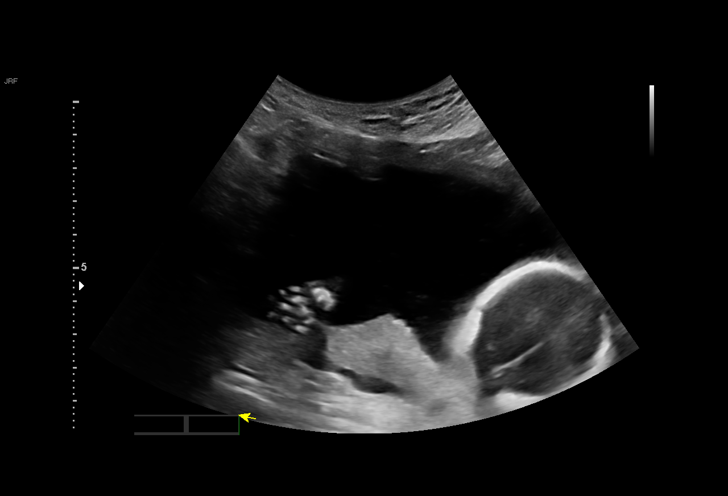

[13 of 28 positions shown; findings below may reference images not displayed]

[id number]

Indications

 Advanced maternal age multigravida 34+,
 second trimester
 Encounter for antenatal screening for
 malformations
 19 weeks gestation of pregnancy
 Genetic carrier (alpha thalassemia)
 Previous cesarean delivery, antepartum
 Neg AFP, low risk NIPS
Fetal Evaluation

 Num Of Fetuses:         1
 Fetal Heart Rate(bpm):  157
 Cardiac Activity:       Observed
 Presentation:           Cephalic
 Placenta:               Posterior
 P. Cord Insertion:      Visualized, central

 Amniotic Fluid
 AFI FV:      Within normal limits

                             Largest Pocket(cm)

Biometry

 BPD:      43.2  mm     G. Age:  19w 0d         47  %    CI:        71.41   %    70 - 86
                                                         FL/HC:      20.1   %    16.1 -
 HC:      162.8  mm     G. Age:  19w 0d         38  %    HC/AC:      1.11        1.09 -
 AC:      146.6  mm     G. Age:  20w 0d         72  %    FL/BPD:     75.7   %
 FL:       32.7  mm     G. Age:  20w 1d         80  %    FL/AC:      22.3   %    20 - 24
 HUM:      30.4  mm     G. Age:  20w 0d         74  %
 CER:      19.7  mm     G. Age:  18w 6d         43  %

 CM:        3.5  mm

 Est. FW:     322  gm    0 lb 11 oz      88  %
OB History

 Gravidity:    4         Term:   2
 TOP:          1        Living:  2
Gestational Age

 U/S Today:     19w 4d                                        EDD:   05/14/20
 Best:          19w 1d     Det. By:  Early Ultrasound         EDD:   05/17/20
                                     (09/27/19)
Anatomy

 Cranium:               Appears normal         Aortic Arch:            Not well visualized
 Cavum:                 Appears normal         Ductal Arch:            Not well visualized
 Ventricles:            Appears normal         Diaphragm:              Appears normal
 Choroid Plexus:        Appears normal         Stomach:                Appears normal, left
                                                                       sided
 Cerebellum:            Appears normal         Abdomen:                Appears normal
 Posterior Fossa:       Appears normal         Abdominal Wall:         Appears nml (cord
                                                                       insert, abd wall)
 Nuchal Fold:           Appears normal         Cord Vessels:           Appears normal (3
                                                                       vessel cord)
 Face:                  Orbits nl; profile not Kidneys:                Appear normal
                        well visualized
 Lips:                  Appears normal         Bladder:                Appears normal
 Thoracic:              Appears normal         Spine:                  Appears normal
 Heart:                 Appears normal         Upper Extremities:      Appears normal
                        (4CH, axis, and
                        situs)
 RVOT:                  Not well visualized    Lower Extremities:      Appears normal
 LVOT:                  Not well visualized

 Other:  Open hands and fifth visualized. Technically difficult due to maternal
         habitus and fetal position.
Cervix Uterus Adnexa

 Cervix
 Length:           5.28  cm.
Comments

 This patient was seen for a detailed fetal anatomy scan due
 to advanced maternal age.
 She denies any significant past medical history and denies
 any problems in her current pregnancy.
 She had a cell free DNA test earlier in her pregnancy which
 indicated a low risk for trisomy 21, 18, and 13. A female fetus
 is predicted.
 She was informed that the fetal growth and amniotic fluid
 level were appropriate for her gestational age.
 There were no obvious fetal anomalies noted on today's
 ultrasound exam.  However, the views of the fetal anatomy
 were limited today due to the fetal position.
 She was advised that anomalies may be missed due to
 technical limitations. If the fetus is in a suboptimal position or
 maternal habitus is increased, visualization of the fetus in the
 maternal uterus may be impaired.
 The increased risk of fetal aneuploidy due to advanced
 maternal age was discussed. Due to advanced maternal age,
 the patient was offered and declined an amniocentesis today
 for definitive diagnosis of fetal aneuploidy.
 A follow-up exam was scheduled in 4 weeks to complete the
 views of the fetal anatomy.

## 2021-04-01 IMAGING — US US MFM OB FOLLOW-UP
1 series · 13 of 28 positions shown · non-contrast
Comparison: none

[Series 1: us mfm ob follow-up · 13 of 66 slices shown]
[im 3/66]
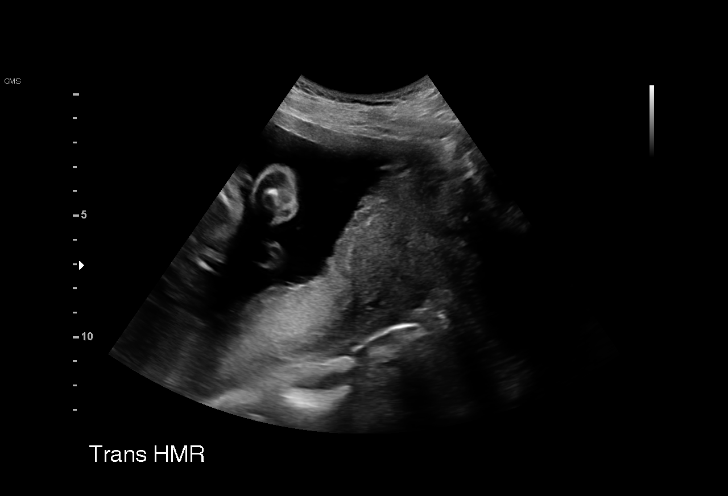
[im 8/66]
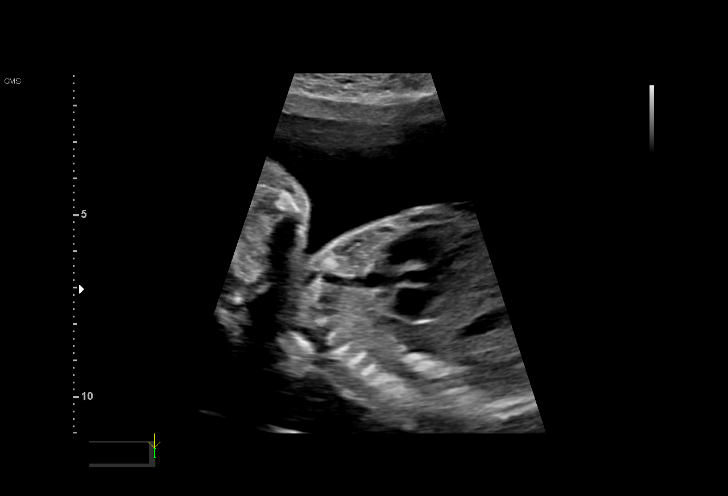
[im 13/66]
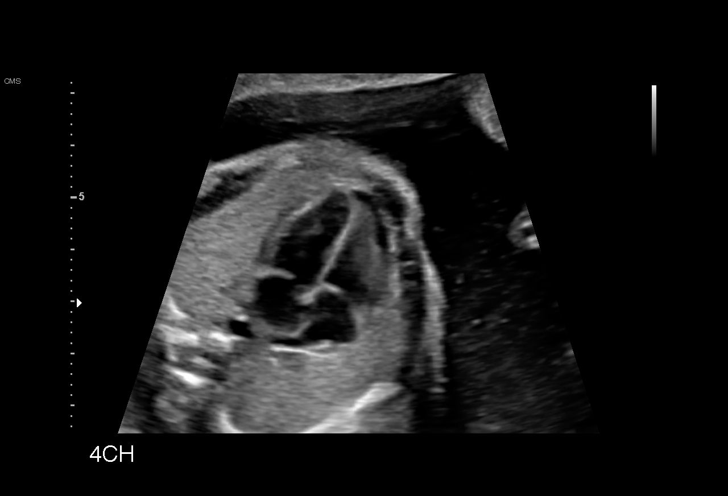
[im 17/66]
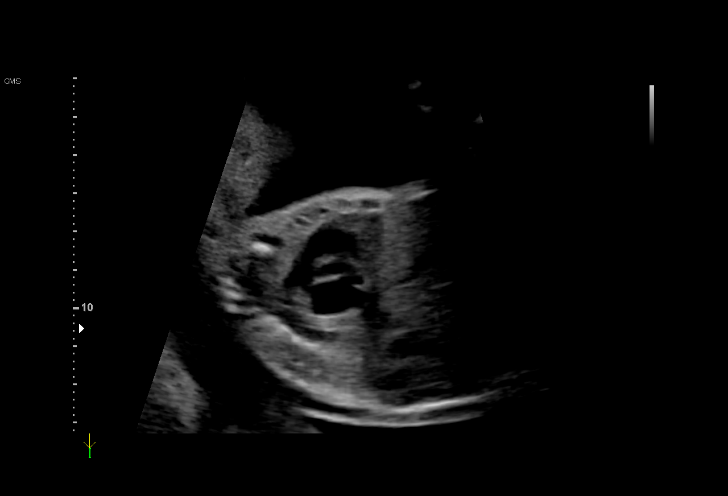
[im 22/66]
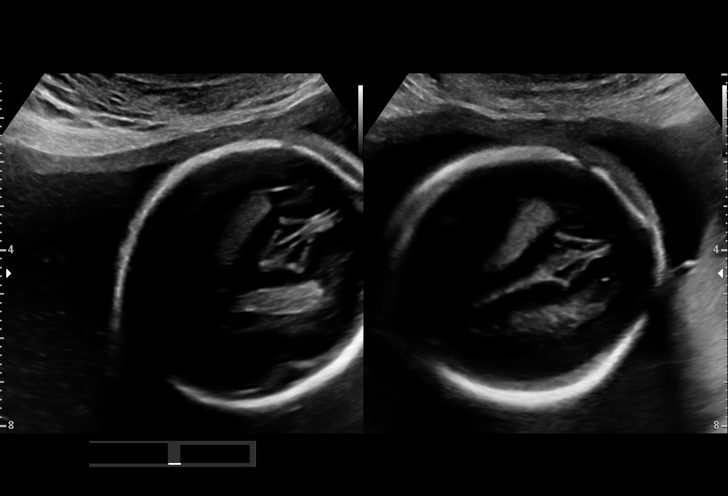
[im 27/66]
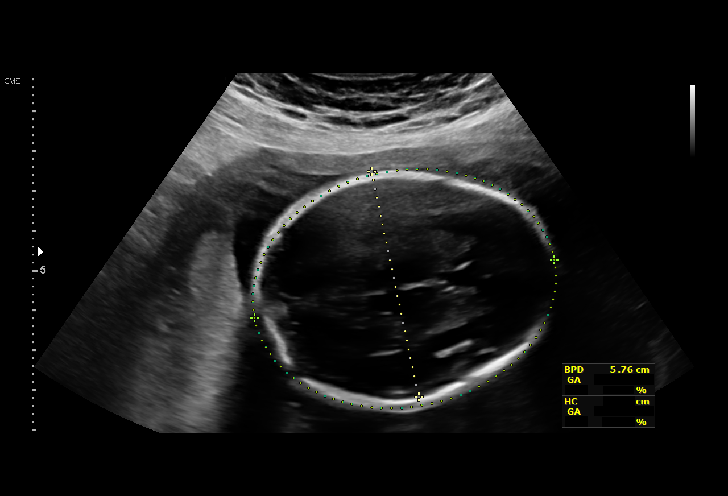
[im 34/66]
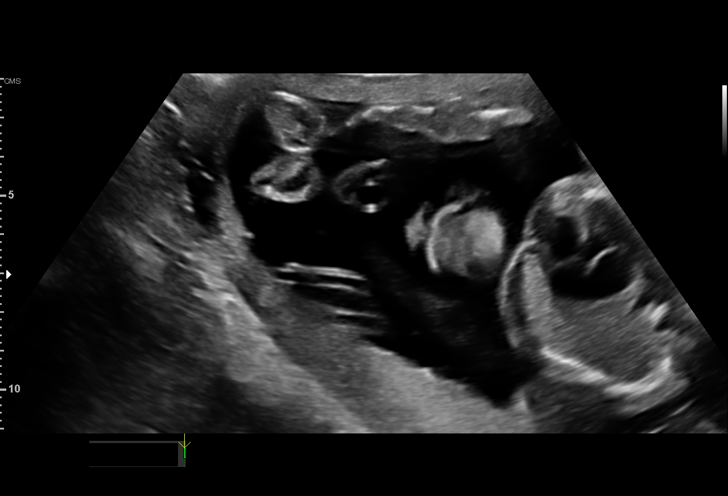
[im 39/66]
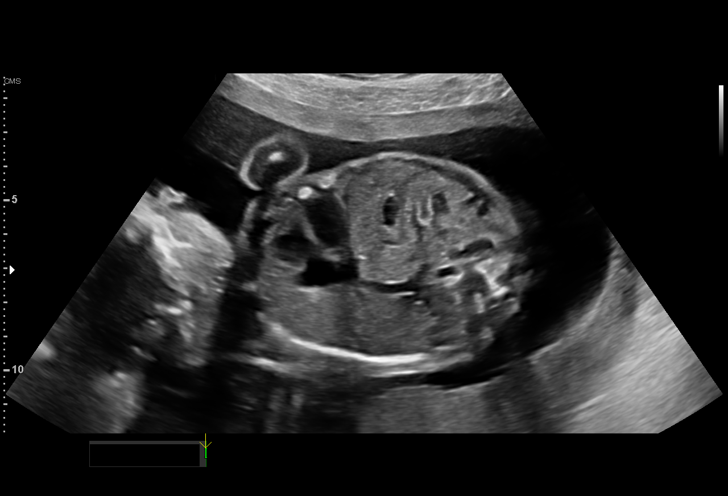
[im 44/66]
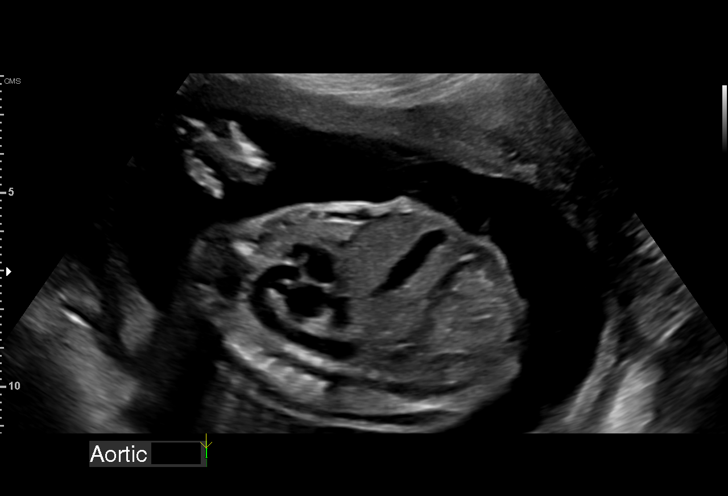
[im 49/66]
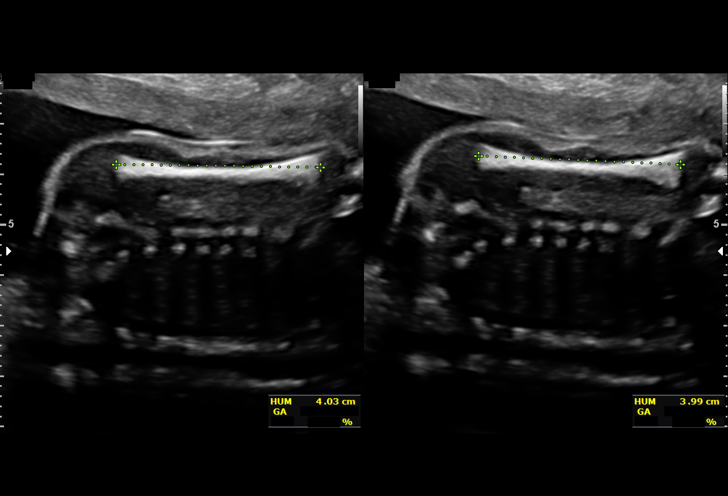
[im 53/66]
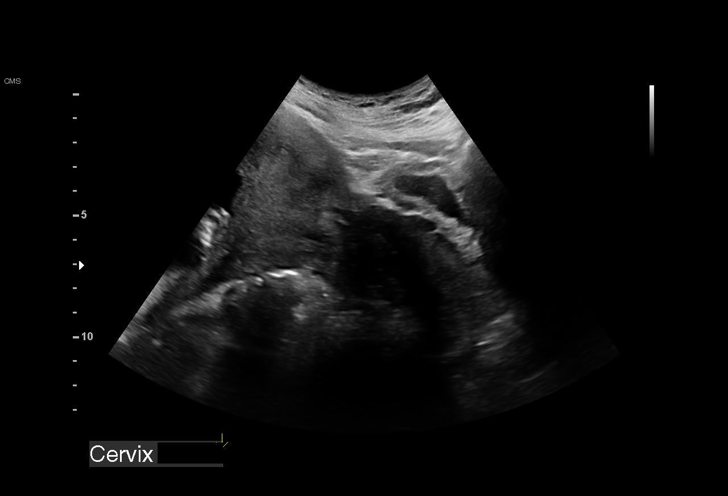
[im 58/66]
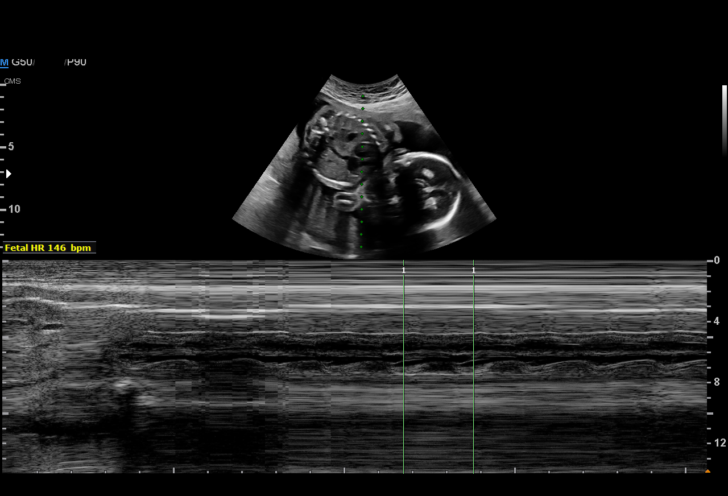
[im 63/66]
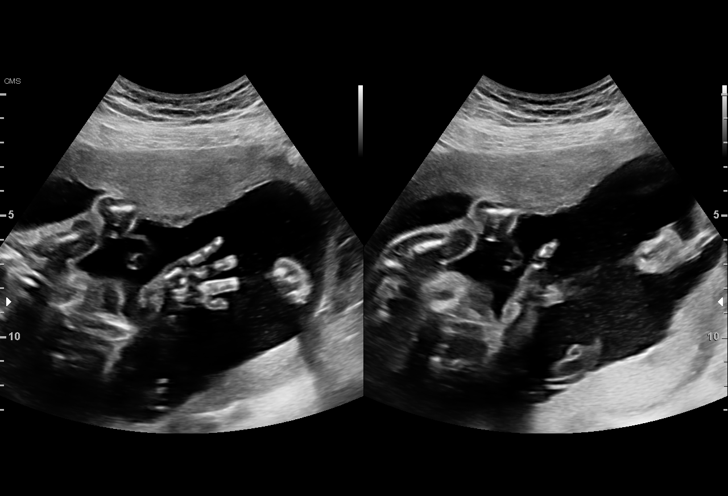

[13 of 28 positions shown; findings below may reference images not displayed]

[id number]

Indications

 Advanced maternal age multigravida 34 by
 delivery, second trimester
 Antenatal follow-up for nonvisualized fetal
 anatomy
 23 weeks gestation of pregnancy
 Genetic carrier (alpha thalassemia)
 Previous cesarean delivery, antepartum
 Neg AFP, low risk NIPS
Fetal Evaluation

 Num Of Fetuses:         1
 Fetal Heart Rate(bpm):  146
 Cardiac Activity:       Observed
 Presentation:           Variable
 Placenta:               Posterior
 P. Cord Insertion:      Previously Visualized

 Amniotic Fluid
 AFI FV:      Within normal limits

                             Largest Pocket(cm)

Biometry

 BPD:      57.6  mm     G. Age:  23w 4d         47  %    CI:        73.42   %    70 - 86
                                                         FL/HC:      20.2   %    18.7 -
 HC:      213.6  mm     G. Age:  23w 3d         28  %    HC/AC:      1.08        1.05 -
 AC:      198.6  mm     G. Age:  24w 4d         71  %    FL/BPD:     74.8   %    71 - 87
 FL:       43.1  mm     G. Age:  24w 1d         56  %    FL/AC:      21.7   %    20 - 24
 HUM:      40.2  mm     G. Age:  24w 3d         62  %

 Est. FW:     672  gm      1 lb 8 oz     73  %
OB History

 Gravidity:    4         Term:   2
 TOP:          1        Living:  2
Gestational Age

 U/S Today:     24w 0d                                        EDD:   05/14/20
 Best:          23w 4d     Det. By:  Early Ultrasound         EDD:   05/17/20
                                     (09/27/19)
Anatomy

 Cranium:               Appears normal         Aortic Arch:            Appears normal
 Cavum:                 Appears normal         Ductal Arch:            Appears normal
 Ventricles:            Appears normal         Diaphragm:              Appears normal
 Choroid Plexus:        Appears normal         Stomach:                Appears normal, left
                                                                       sided
 Cerebellum:            Appears normal         Abdomen:                Appears normal
 Posterior Fossa:       Appears normal         Abdominal Wall:         Appears nml (cord
                                                                       insert, abd wall)
 Nuchal Fold:           Appears normal         Cord Vessels:           Appears normal (3
                                                                       vessel cord)
 Face:                  Appears normal         Kidneys:                Appear normal
                        (orbits and profile)
 Lips:                  Appears normal         Bladder:                Appears normal
 Thoracic:              Appears normal         Spine:                  Previously seen
 Heart:                 Appears normal         Upper Extremities:      Previously seen
                        (4CH, axis, and
                        situs)
 RVOT:                  Appears normal         Lower Extremities:      Previously seen
 LVOT:                  Appears normal

 Other:  Open hands and fifth previously visualized. Heels/feet visualized.
         Nasal bone visualized.
Cervix Uterus Adnexa

 Cervix
 Normal appearance by transabdominal scan.

 Uterus
 No abnormality visualized.

 Cul De Sac
 No free fluid seen.

 Adnexa
 No abnormality visualized.
Comments

 This patient was seen for a follow up exam as the views of
 the fetal anatomy were unable to be fully visualized during
 her last exam.  She denies any problems since her last exam.
 She was informed that the fetal growth and amniotic fluid
 level appears appropriate for her gestational age.
 The views of the fetal anatomy were visualized today.  There
 were no obvious anomalies noted.
 The limitations of ultrasound in the detection of all anomalies
 was discussed.
 Follow-up as indicated.

## 2021-04-29 ENCOUNTER — Other Ambulatory Visit: Payer: Self-pay | Admitting: *Deleted

## 2021-04-29 NOTE — Telephone Encounter (Signed)
erroneous
